# Patient Record
Sex: Female | Born: 1980 | Race: White | Hispanic: No | Marital: Married | Smoking: Never smoker
Health system: Southern US, Community
[De-identification: ages and names within clinical notes are randomized; demographics above are authoritative.]

## PROBLEM LIST (undated history)

## (undated) DIAGNOSIS — I10 Essential (primary) hypertension: Secondary | ICD-10-CM

## (undated) DIAGNOSIS — F419 Anxiety disorder, unspecified: Secondary | ICD-10-CM

## (undated) DIAGNOSIS — F909 Attention-deficit hyperactivity disorder, unspecified type: Secondary | ICD-10-CM

## (undated) HISTORY — PX: CHOLECYSTECTOMY: SHX55

## (undated) HISTORY — PX: APPENDECTOMY: SHX54

## (undated) HISTORY — PX: TONSILLECTOMY: SUR1361

---

## 2009-01-24 ENCOUNTER — Emergency Department (HOSPITAL_BASED_OUTPATIENT_CLINIC_OR_DEPARTMENT_OTHER): Admission: EM | Admit: 2009-01-24 | Discharge: 2009-01-24 | Payer: Self-pay | Admitting: Emergency Medicine

## 2017-09-10 ENCOUNTER — Other Ambulatory Visit: Payer: Self-pay

## 2017-09-10 ENCOUNTER — Encounter (HOSPITAL_BASED_OUTPATIENT_CLINIC_OR_DEPARTMENT_OTHER): Payer: Self-pay | Admitting: *Deleted

## 2017-09-10 ENCOUNTER — Emergency Department (HOSPITAL_BASED_OUTPATIENT_CLINIC_OR_DEPARTMENT_OTHER): Payer: No Typology Code available for payment source

## 2017-09-10 ENCOUNTER — Emergency Department (HOSPITAL_BASED_OUTPATIENT_CLINIC_OR_DEPARTMENT_OTHER)
Admission: EM | Admit: 2017-09-10 | Discharge: 2017-09-10 | Disposition: A | Discharge status: Home / Self Care | Payer: No Typology Code available for payment source | Attending: Emergency Medicine | Admitting: Emergency Medicine

## 2017-09-10 DIAGNOSIS — S99911A Unspecified injury of right ankle, initial encounter: Secondary | ICD-10-CM | POA: Diagnosis present

## 2017-09-10 DIAGNOSIS — Y999 Unspecified external cause status: Secondary | ICD-10-CM | POA: Insufficient documentation

## 2017-09-10 DIAGNOSIS — W109XXA Fall (on) (from) unspecified stairs and steps, initial encounter: Secondary | ICD-10-CM | POA: Diagnosis not present

## 2017-09-10 DIAGNOSIS — S82891A Other fracture of right lower leg, initial encounter for closed fracture: Secondary | ICD-10-CM | POA: Diagnosis not present

## 2017-09-10 DIAGNOSIS — Y9301 Activity, walking, marching and hiking: Secondary | ICD-10-CM | POA: Insufficient documentation

## 2017-09-10 DIAGNOSIS — I10 Essential (primary) hypertension: Secondary | ICD-10-CM | POA: Insufficient documentation

## 2017-09-10 DIAGNOSIS — S93491A Sprain of other ligament of right ankle, initial encounter: Secondary | ICD-10-CM | POA: Diagnosis not present

## 2017-09-10 DIAGNOSIS — Z79899 Other long term (current) drug therapy: Secondary | ICD-10-CM | POA: Insufficient documentation

## 2017-09-10 DIAGNOSIS — Y929 Unspecified place or not applicable: Secondary | ICD-10-CM | POA: Diagnosis not present

## 2017-09-10 HISTORY — DX: Essential (primary) hypertension: I10

## 2017-09-10 HISTORY — DX: Anxiety disorder, unspecified: F41.9

## 2017-09-10 HISTORY — DX: Attention-deficit hyperactivity disorder, unspecified type: F90.9

## 2017-09-10 MED ORDER — ACETAMINOPHEN 500 MG PO TABS
1000.0000 mg | ORAL_TABLET | Freq: Once | ORAL | Status: AC
  Administered 2017-09-10: 1000 mg via ORAL
  Filled 2017-09-10: qty 2

## 2017-09-10 MED ORDER — OXYCODONE HCL 5 MG PO TABS
5.0000 mg | ORAL_TABLET | Freq: Once | ORAL | Status: DC
Start: 2017-09-10 — End: 2017-09-11
  Filled 2017-09-10: qty 1

## 2017-09-10 MED ORDER — MORPHINE SULFATE 15 MG PO TABS: 15.0000 mg | ORAL_TABLET | ORAL | 0 refills | Status: DC | PRN

## 2017-09-10 MED ORDER — KETOROLAC TROMETHAMINE 15 MG/ML IJ SOLN
15.0000 mg | Freq: Once | INTRAMUSCULAR | Status: AC
  Administered 2017-09-10: 15 mg via INTRAMUSCULAR
  Filled 2017-09-10: qty 1

## 2017-09-10 NOTE — Discharge Instructions (Signed)

## 2017-09-10 NOTE — ED Provider Notes (Signed)
[facility] EMERGENCY DEPARTMENT Provider Note   CSN: [id number] Arrival date & time: 09/10/17  2025     History   Chief Complaint Chief Complaint  Patient presents with  . Fall  . Foot Injury    HPI Kathryn ColeJennifer Holloway is a 37 y.o. female.  37 yo F with a chief complaint of right ankle pain.  This happened when the patient lost her balance and fell down about 4 steps.  She thinks that she inverted the ankle.  Pain to the right lateral aspect of the foot as well as the ankle.  Denies other injury denies pain at the knee.  The history is provided by the patient.  Fall  This is a new problem. The current episode started 3 to 5 hours ago. The problem occurs constantly. The problem has not changed since onset.Pertinent negatives include no chest pain, no headaches and no shortness of breath. The symptoms are aggravated by bending and walking. Nothing relieves the symptoms. She has tried nothing for the symptoms. The treatment provided no relief.  Foot Injury      Past Medical History:  Diagnosis Date  . ADHD   . Anxiety   . Hypertension     There are no active problems to display for this patient.   Past Surgical History:  Procedure Laterality Date  . APPENDECTOMY    . CESAREAN SECTION    . CHOLECYSTECTOMY    . TONSILLECTOMY       OB History   None      Home Medications    Prior to Admission medications   Medication Sig Start Date End Date Taking? Authorizing Provider  desvenlafaxine (PRISTIQ) 100 MG 24 hr tablet Take 100 mg by mouth daily.   Yes [provider]  lisdexamfetamine (VYVANSE) 70 MG capsule Take 70 mg by mouth daily.   Yes [provider]  Nebivolol HCl (BYSTOLIC PO) Take by mouth.   Yes [provider]  morphine (MSIR) 15 MG tablet Take 1 tablet (15 mg total) by mouth every 4 (four) hours as needed for severe pain. 09/10/17   Melene PlanFloyd, Gitty Osterlund, DO    Family History No family history on file.  Social  History Social History   Tobacco Use  . Smoking status: Never Smoker  . Smokeless tobacco: Never Used  Substance Use Topics  . Alcohol use: Never    Frequency: Never  . Drug use: Never     Allergies   Amoxicillin and Sulfa antibiotics   Review of Systems Review of Systems  Constitutional: Negative for chills and fever.  HENT: Negative for congestion and rhinorrhea.   Eyes: Negative for redness and visual disturbance.  Respiratory: Negative for shortness of breath and wheezing.   Cardiovascular: Negative for chest pain and palpitations.  Gastrointestinal: Negative for nausea and vomiting.  Genitourinary: Negative for dysuria and urgency.  Musculoskeletal: Positive for arthralgias. Negative for myalgias.  Skin: Negative for pallor and wound.  Neurological: Negative for dizziness and headaches.     Physical Exam Updated Vital Signs BP (!) 145/94 (BP Location: Left Arm)   Pulse 90   Temp 98.8 F (37.1 C) (Oral)   Resp 20   Ht 5\' 8"  (1.727 m)   Wt 136.1 kg   LMP 08/20/2017   SpO2 98%   BMI 45.61 kg/m   Physical Exam  Constitutional: She is oriented to person, place, and time. She appears well-developed and well-nourished. No distress.  HENT:  Head: Normocephalic and atraumatic.  Eyes:  Pupils are equal, round, and reactive to light. EOM are normal.  Neck: Normal range of motion. Neck supple.  Cardiovascular: Normal rate and regular rhythm. Exam reveals no gallop and no friction rub.  No murmur heard. Pulmonary/Chest: Effort normal. She has no wheezes. She has no rales.  Abdominal: Soft. She exhibits no distension. There is no tenderness.  Musculoskeletal: She exhibits no edema or tenderness.  Pain and swelling to the right lateral ankle, just distal to the lateral malleolus, pain to the base of the fifth metatarsal.   Neurological: She is alert and oriented to person, place, and time.  Skin: Skin is warm and dry. She is not diaphoretic.  Psychiatric: She has a  normal mood and affect. Her behavior is normal.  Nursing note and vitals reviewed.    ED Treatments / Results  Labs (all labs ordered are listed, but only abnormal results are displayed) Labs Reviewed - No data to display  EKG None  Radiology Dg Ankle Complete Right  Result Date: 09/10/2017 CLINICAL DATA:  Ankle pain after fall EXAM: RIGHT ANKLE - COMPLETE 3+ VIEW COMPARISON:  None. FINDINGS: There is no evidence of fracture, dislocation, or joint effusion. There is no evidence of arthropathy or other focal bone abnormality. Soft tissues are unremarkable. IMPRESSION: Negative. Electronically Signed   By: Jasmine PangKim  Fujinaga M.D.   On: 09/10/2017 21:23   Dg Foot Complete Right  Result Date: 09/10/2017 CLINICAL DATA:  Larey SeatFell down steps with foot and ankle pain EXAM: RIGHT FOOT COMPLETE - 3+ VIEW COMPARISON:  None. FINDINGS: No malalignment. Small ossific densities adjacent to the lateral cuboid and anterior calcaneus. Small plantar calcaneal spur IMPRESSION: Small bony densities adjacent to the lateral cuboid and anterior calcaneus, may reflect small age indeterminate fracture fragments. Electronically Signed   By: Jasmine PangKim  Fujinaga M.D.   On: 09/10/2017 21:22    Procedures Procedures (including critical care time)  Medications Ordered in ED Medications  oxyCODONE (Oxy IR/ROXICODONE) immediate release tablet 5 mg (5 mg Oral Not Given 09/10/17 2135)  acetaminophen (TYLENOL) tablet 1,000 mg (1,000 mg Oral Given 09/10/17 2132)  ketorolac (TORADOL) 15 MG/ML injection 15 mg (15 mg Intramuscular Given 09/10/17 2126)     Initial Impression / Assessment and Plan / ED Course  I have reviewed the triage vital signs and the nursing notes.  Pertinent labs & imaging results that were available during my care of the patient were reviewed by me and considered in my medical decision making (see chart for details).     37 yo F with a chief complaint of right ankle pain.  Will obtain a plain film of the ankle  and foot. Plain film with increased density along the course of the ATF.  I suspect that this is just a sprain, radiology read as a possible avulsion fracture.  I suspect will be treated the same.  Placed in a cam walker crutches given sports medicine follow-up.  The patients results and plan were reviewed and discussed.   Any x-rays performed were independently reviewed by myself.   Differential diagnosis were considered with the presenting HPI.  Medications  oxyCODONE (Oxy IR/ROXICODONE) immediate release tablet 5 mg (5 mg Oral Not Given 09/10/17 2135)  acetaminophen (TYLENOL) tablet 1,000 mg (1,000 mg Oral Given 09/10/17 2132)  ketorolac (TORADOL) 15 MG/ML injection 15 mg (15 mg Intramuscular Given 09/10/17 2126)    Vitals:   09/10/17 2029 09/10/17 2030  BP:  (!) 145/94  Pulse:  90  Resp:  20  Temp:  98.8 F (37.1 C)  TempSrc:  Oral  SpO2:  98%  Weight: 136.1 kg   Height: 5\' 8"  (1.727 m)     Final diagnoses:  Sprain of anterior talofibular ligament of right ankle, initial encounter  Avulsion fracture of ankle, right, closed, initial encounter    Admission/ observation were discussed with the admitting physician, patient and/or family and they are comfortable with the plan.    Final Clinical Impressions(s) / ED Diagnoses   Final diagnoses:  Sprain of anterior talofibular ligament of right ankle, initial encounter  Avulsion fracture of ankle, right, closed, initial encounter    ED Discharge Orders         Ordered    morphine (MSIR) 15 MG tablet  Every 4 hours PRN     09/10/17 2139           Melene Plan, DO 09/10/17 2146

## 2017-09-10 NOTE — ED Triage Notes (Signed)
She fell down 4 steps an hour ago. Injury to her right foot. Swelling noted.

## 2018-01-29 ENCOUNTER — Encounter (HOSPITAL_BASED_OUTPATIENT_CLINIC_OR_DEPARTMENT_OTHER): Payer: Self-pay | Admitting: *Deleted

## 2018-01-29 ENCOUNTER — Other Ambulatory Visit: Payer: Self-pay

## 2018-01-29 ENCOUNTER — Emergency Department (HOSPITAL_BASED_OUTPATIENT_CLINIC_OR_DEPARTMENT_OTHER)
Admission: EM | Admit: 2018-01-29 | Discharge: 2018-01-29 | Disposition: A | Discharge status: Home / Self Care | Payer: No Typology Code available for payment source | Attending: Emergency Medicine | Admitting: Emergency Medicine

## 2018-01-29 ENCOUNTER — Emergency Department (HOSPITAL_BASED_OUTPATIENT_CLINIC_OR_DEPARTMENT_OTHER): Payer: No Typology Code available for payment source

## 2018-01-29 DIAGNOSIS — I10 Essential (primary) hypertension: Secondary | ICD-10-CM | POA: Insufficient documentation

## 2018-01-29 DIAGNOSIS — F909 Attention-deficit hyperactivity disorder, unspecified type: Secondary | ICD-10-CM | POA: Insufficient documentation

## 2018-01-29 DIAGNOSIS — N23 Unspecified renal colic: Secondary | ICD-10-CM | POA: Diagnosis not present

## 2018-01-29 DIAGNOSIS — Z79899 Other long term (current) drug therapy: Secondary | ICD-10-CM | POA: Diagnosis not present

## 2018-01-29 DIAGNOSIS — R109 Unspecified abdominal pain: Secondary | ICD-10-CM | POA: Diagnosis present

## 2018-01-29 DIAGNOSIS — N130 Hydronephrosis with ureteropelvic junction obstruction: Secondary | ICD-10-CM | POA: Insufficient documentation

## 2018-01-29 DIAGNOSIS — N201 Calculus of ureter: Secondary | ICD-10-CM

## 2018-01-29 LAB — CBC WITH DIFFERENTIAL/PLATELET
ABS IMMATURE GRANULOCYTES: 0.12 10*3/uL — AB (ref 0.00–0.07)
BASOS ABS: 0.1 10*3/uL (ref 0.0–0.1)
BASOS PCT: 1 %
Eosinophils Absolute: 0.1 10*3/uL (ref 0.0–0.5)
Eosinophils Relative: 1 %
HCT: 41 % (ref 36.0–46.0)
Hemoglobin: 12.9 g/dL (ref 12.0–15.0)
Immature Granulocytes: 1 %
LYMPHS PCT: 27 %
Lymphs Abs: 3.2 10*3/uL (ref 0.7–4.0)
MCH: 28.9 pg (ref 26.0–34.0)
MCHC: 31.5 g/dL (ref 30.0–36.0)
MCV: 91.7 fL (ref 80.0–100.0)
Monocytes Absolute: 0.6 10*3/uL (ref 0.1–1.0)
Monocytes Relative: 5 %
NEUTROS ABS: 7.6 10*3/uL (ref 1.7–7.7)
NRBC: 0 % (ref 0.0–0.2)
Neutrophils Relative %: 65 %
PLATELETS: 288 10*3/uL (ref 150–400)
RBC: 4.47 MIL/uL (ref 3.87–5.11)
RDW: 13.2 % (ref 11.5–15.5)
WBC: 11.7 10*3/uL — ABNORMAL HIGH (ref 4.0–10.5)

## 2018-01-29 LAB — URINALYSIS, ROUTINE W REFLEX MICROSCOPIC
BILIRUBIN URINE: NEGATIVE
GLUCOSE, UA: NEGATIVE mg/dL
KETONES UR: NEGATIVE mg/dL
Leukocytes, UA: NEGATIVE
NITRITE: NEGATIVE
Protein, ur: NEGATIVE mg/dL
Specific Gravity, Urine: 1.02 (ref 1.005–1.030)
pH: 5.5 (ref 5.0–8.0)

## 2018-01-29 LAB — BASIC METABOLIC PANEL
ANION GAP: 7 (ref 5–15)
BUN: 11 mg/dL (ref 6–20)
CHLORIDE: 102 mmol/L (ref 98–111)
CO2: 26 mmol/L (ref 22–32)
Calcium: 8.9 mg/dL (ref 8.9–10.3)
Creatinine, Ser: 0.66 mg/dL (ref 0.44–1.00)
Glucose, Bld: 138 mg/dL — ABNORMAL HIGH (ref 70–99)
POTASSIUM: 3.9 mmol/L (ref 3.5–5.1)
Sodium: 135 mmol/L (ref 135–145)

## 2018-01-29 LAB — URINALYSIS, MICROSCOPIC (REFLEX)

## 2018-01-29 LAB — PREGNANCY, URINE: Preg Test, Ur: NEGATIVE

## 2018-01-29 MED ORDER — ONDANSETRON 4 MG PO TBDP: 4.0000 mg | ORAL_TABLET | Freq: Three times a day (TID) | ORAL | 0 refills | Status: AC | PRN

## 2018-01-29 MED ORDER — SODIUM CHLORIDE 0.9 % IV BOLUS
1000.0000 mL | Freq: Once | INTRAVENOUS | Status: AC
  Administered 2018-01-29: 1000 mL via INTRAVENOUS

## 2018-01-29 MED ORDER — HYDROCODONE-ACETAMINOPHEN 5-325 MG PO TABS: 1.0000 | ORAL_TABLET | Freq: Four times a day (QID) | ORAL | 0 refills | Status: DC | PRN

## 2018-01-29 NOTE — ED Provider Notes (Signed)
[facility] EMERGENCY DEPARTMENT Provider Note   CSN: [id number] Arrival date & time: 01/29/18  1748     History   Chief Complaint Chief Complaint  Patient presents with  . Flank Pain    HPI Kathryn ColeJennifer Holloway is a 37 y.o. female.  HPI   Kathryn ColeJennifer Bunnell is a 37 y.o. female, with a history of anxiety and HTN, presenting to the ED with left back pain beginning this afternoon while at rest.  At onset, pain was sharp, 10/10, radiating to the left flank and left abdomen.  Pain has since subsided, now rated 3/10.  Accompanied by nausea. She has not experienced this pain before.  Denies fever/chills, chest pain, shortness of breath, dysuria, hematuria, vomiting, diarrhea, hematochezia/melena, neuro deficits, or any other complaints.     Past Medical History:  Diagnosis Date  . ADHD   . Anxiety   . Hypertension     There are no active problems to display for this patient.   Past Surgical History:  Procedure Laterality Date  . APPENDECTOMY    . CESAREAN SECTION    . CHOLECYSTECTOMY    . TONSILLECTOMY       OB History   No obstetric history on file.      Home Medications    Prior to Admission medications   Medication Sig Start Date End Date Taking? Authorizing Provider  amphetamine-dextroamphetamine (ADDERALL XR) 30 MG 24 hr capsule Take by mouth. 12/07/17  Yes [provider]  desvenlafaxine (PRISTIQ) 100 MG 24 hr tablet Take 100 mg by mouth daily.   Yes [provider]  Nebivolol HCl (BYSTOLIC PO) Take by mouth.   Yes [provider]  omalizumab Geoffry Paradise(XOLAIR) 150 MG injection Inject into the skin. 08/17/17  Yes [provider]  HYDROcodone-acetaminophen (NORCO/VICODIN) 5-325 MG tablet Take 1-2 tablets by mouth every 6 (six) hours as needed for severe pain. 01/29/18   Uchenna Rappaport C, PA-C  lisdexamfetamine (VYVANSE) 70 MG capsule Take 70 mg by mouth daily.    [provider]  morphine (MSIR) 15 MG tablet Take 1  tablet (15 mg total) by mouth every 4 (four) hours as needed for severe pain. 09/10/17   Melene PlanFloyd, Dan, DO  ondansetron (ZOFRAN ODT) 4 MG disintegrating tablet Take 1 tablet (4 mg total) by mouth every 8 (eight) hours as needed for nausea or vomiting. 01/29/18   Jaimere Feutz, Hillard DankerShawn C, PA-C    Family History No family history on file.  Social History Social History   Tobacco Use  . Smoking status: Never Smoker  . Smokeless tobacco: Never Used  Substance Use Topics  . Alcohol use: Never    Frequency: Never  . Drug use: Never     Allergies   Amoxicillin and Sulfa antibiotics   Review of Systems Review of Systems  Constitutional: Negative for chills, diaphoresis and fever.  Respiratory: Negative for cough and shortness of breath.   Cardiovascular: Negative for chest pain.  Gastrointestinal: Positive for nausea. Negative for blood in stool, constipation, diarrhea and vomiting.  Genitourinary: Positive for flank pain. Negative for dysuria, hematuria, vaginal bleeding and vaginal discharge.  Musculoskeletal: Positive for back pain.  Neurological: Negative for weakness and numbness.  All other systems reviewed and are negative.    Physical Exam Updated Vital Signs BP 139/85 (BP Location: Left Arm)   Pulse 93   Temp 98.2 F (36.8 C) (Oral)   Resp 20   Ht 5\' 8"  (1.727 m)   Wt 136.1 kg   LMP  01/22/2018   SpO2 95%   BMI 45.61 kg/m   Physical Exam Vitals signs and nursing note reviewed.  Constitutional:      General: She is not in acute distress.    Appearance: She is well-developed. She is not diaphoretic.  HENT:     Head: Normocephalic and atraumatic.  Eyes:     Conjunctiva/sclera: Conjunctivae normal.  Neck:     Musculoskeletal: Neck supple.  Cardiovascular:     Rate and Rhythm: Normal rate and regular rhythm.     Heart sounds: Normal heart sounds.  Pulmonary:     Effort: Pulmonary effort is normal. No respiratory distress.     Breath sounds: Normal breath sounds.    Abdominal:     Palpations: Abdomen is soft.     Tenderness: There is no abdominal tenderness. There is left CVA tenderness. There is no guarding.  Lymphadenopathy:     Cervical: No cervical adenopathy.  Skin:    General: Skin is warm and dry.  Neurological:     Mental Status: She is alert.  Psychiatric:        Behavior: Behavior normal.      ED Treatments / Results  Labs (all labs ordered are listed, but only abnormal results are displayed) Labs Reviewed  URINALYSIS, ROUTINE W REFLEX MICROSCOPIC - Abnormal; Notable for the following components:      Result Value   Hgb urine dipstick LARGE (*)    All other components within normal limits  URINALYSIS, MICROSCOPIC (REFLEX) - Abnormal; Notable for the following components:   Bacteria, UA RARE (*)    All other components within normal limits  BASIC METABOLIC PANEL - Abnormal; Notable for the following components:   Glucose, Bld 138 (*)    All other components within normal limits  CBC WITH DIFFERENTIAL/PLATELET - Abnormal; Notable for the following components:   WBC 11.7 (*)    Abs Immature Granulocytes 0.12 (*)    All other components within normal limits  PREGNANCY, URINE    EKG None  Radiology Ct Renal Stone Study  Result Date: 01/29/2018 CLINICAL DATA:  Left flank pain for 1 day. Dyspnea. History of appendectomy and cholecystectomy. EXAM: CT ABDOMEN AND PELVIS WITHOUT CONTRAST TECHNIQUE: Multidetector CT imaging of the abdomen and pelvis was performed following the standard protocol without IV contrast. COMPARISON:  None. FINDINGS: Lower chest: No significant pulmonary nodules or acute consolidative airspace disease. Hepatobiliary: Diffuse hepatic steatosis. No definite liver surface irregularity. Normal liver size. No liver mass. Cholecystectomy. No biliary ductal dilatation. Pancreas: Normal, with no mass or duct dilation. Spleen: Normal size. No mass. Adrenals/Urinary Tract: Normal adrenals. Obstructing 3 mm left  ureterovesical junction stone with mild left hydroureteronephrosis. No stones in the kidneys. No additional ureteral stones. No right hydronephrosis. Normal caliber right ureter. No contour deforming renal masses. Normal bladder. Stomach/Bowel: Normal non-distended stomach. Normal caliber small bowel with no small bowel wall thickening. Appendectomy. Normal large bowel with no diverticulosis, large bowel wall thickening or pericolonic fat stranding. Vascular/Lymphatic: Normal caliber abdominal aorta. No pathologically enlarged lymph nodes in the abdomen or pelvis. Reproductive: Normal uterus. Simple 5.4 cm right adnexal cyst (series 2/image 87). No left adnexal mass. Other: No pneumoperitoneum, ascites or focal fluid collection. Musculoskeletal: No aggressive appearing focal osseous lesions. Mild lower thoracic spondylosis. IMPRESSION: 1. Obstructing 3 mm left UVJ stone with mild left hydroureteronephrosis. 2. Simple 5.4 cm right adnexal cyst. Follow-up pelvic ultrasound advised in 6-12 weeks. This recommendation follows ACR consensus guidelines: White Paper of the ACR  Incidental Findings Committee II on Adnexal Findings. J Am Coll Radiol 2013:10:675-681. 3. Diffuse hepatic steatosis. Electronically Signed   By: Delbert Phenix M.D.   On: 01/29/2018 21:19    Procedures Procedures (including critical care time)  Medications Ordered in ED Medications  sodium chloride 0.9 % bolus 1,000 mL ( Intravenous Stopped 01/29/18 2151)     Initial Impression / Assessment and Plan / ED Course  I have reviewed the triage vital signs and the nursing notes.  Pertinent labs & imaging results that were available during my care of the patient were reviewed by me and considered in my medical decision making (see chart for details).     Patient presents with left flank pain. Patient is nontoxic appearing, afebrile, not tachycardic, not tachypneic, not hypotensive, and is in no apparent distress.  3 mm stone at the UVJ.   Patient's pain is well controlled without analgesics here in the ED. The patient was given instructions for home care as well as return precautions. Patient voices understanding of these instructions, accepts the plan, and is comfortable with discharge.    Final Clinical Impressions(s) / ED Diagnoses   Final diagnoses:  Ureterolithiasis  Ureteral colic    ED Discharge Orders         Ordered    HYDROcodone-acetaminophen (NORCO/VICODIN) 5-325 MG tablet  Every 6 hours PRN     01/29/18 2217    ondansetron (ZOFRAN ODT) 4 MG disintegrating tablet  Every 8 hours PRN     01/29/18 2217           Anselm Pancoast, PA-C 01/29/18 2332    Gwyneth Sprout, MD 01/31/18 2154

## 2018-01-29 NOTE — ED Notes (Signed)
Patient transported to CT 

## 2018-01-29 NOTE — ED Notes (Signed)
Pt collected enough drops of urine for a urine preg but not a UA.  Pt aware that she needs another urine specimen as soon as she is able.

## 2018-01-29 NOTE — Discharge Instructions (Signed)
°  Kidney Stone There is evidence of a kidney stone on the left side.  It appears as though it is on its way out.  Some kidney stones can take up to 30 days to pass. Hydration: Hydration is key to helping a kidney stone pass.  Have a goal of half a liter of water every hour or two. Antiinflammatory medications: Take 600 mg of ibuprofen every 6 hours or 440 mg (over the counter dose) to 500 mg (prescription dose) of naproxen every 12 hours for the next 3 days. After this time, these medications may be used as needed for pain. Take these medications with food to avoid upset stomach. Choose only one of these medications, do not take them together. Tylenol: Should you continue to have additional pain while taking the ibuprofen or naproxen, you may add in tylenol as needed. Your daily total maximum amount of tylenol from all sources should be limited to 4000mg /day for persons without liver problems, or 2000mg /day for those with liver problems. Vicodin: May take Vicodin (hydrocodone-acetaminophen) as needed for severe pain.  Do not drive or perform other dangerous activities while taking the Vicodin.  Please note that each pill of Vicodin contains 325 mg of acetaminophen (Tylenol) and the above dosage limits apply. Zofran: Use of Zofran, as needed, for nausea/vomiting. Follow-up: Follow-up with the urologist as soon as possible on this matter.  For prescription assistance, may try using prescription discount sites or apps, such as goodrx.com

## 2018-01-29 NOTE — ED Triage Notes (Signed)
Left flank pain

## 2019-02-18 ENCOUNTER — Telehealth: Payer: Self-pay | Admitting: Emergency Medicine

## 2019-02-18 ENCOUNTER — Encounter (HOSPITAL_BASED_OUTPATIENT_CLINIC_OR_DEPARTMENT_OTHER): Payer: Self-pay

## 2019-02-18 ENCOUNTER — Other Ambulatory Visit: Payer: Self-pay

## 2019-02-18 ENCOUNTER — Inpatient Hospital Stay (HOSPITAL_BASED_OUTPATIENT_CLINIC_OR_DEPARTMENT_OTHER)
Admission: EM | Admit: 2019-02-18 | Discharge: 2019-02-20 | DRG: 177 | Disposition: A | Discharge status: Home / Self Care | Payer: PRIVATE HEALTH INSURANCE | Attending: Internal Medicine | Admitting: Internal Medicine

## 2019-02-18 ENCOUNTER — Emergency Department (HOSPITAL_BASED_OUTPATIENT_CLINIC_OR_DEPARTMENT_OTHER): Payer: PRIVATE HEALTH INSURANCE

## 2019-02-18 DIAGNOSIS — Z880 Allergy status to penicillin: Secondary | ICD-10-CM

## 2019-02-18 DIAGNOSIS — Z9089 Acquired absence of other organs: Secondary | ICD-10-CM

## 2019-02-18 DIAGNOSIS — F909 Attention-deficit hyperactivity disorder, unspecified type: Secondary | ICD-10-CM | POA: Diagnosis present

## 2019-02-18 DIAGNOSIS — R0602 Shortness of breath: Secondary | ICD-10-CM | POA: Diagnosis present

## 2019-02-18 DIAGNOSIS — G8929 Other chronic pain: Secondary | ICD-10-CM | POA: Diagnosis present

## 2019-02-18 DIAGNOSIS — Z98891 History of uterine scar from previous surgery: Secondary | ICD-10-CM | POA: Diagnosis not present

## 2019-02-18 DIAGNOSIS — J45901 Unspecified asthma with (acute) exacerbation: Secondary | ICD-10-CM | POA: Diagnosis present

## 2019-02-18 DIAGNOSIS — J1282 Pneumonia due to coronavirus disease 2019: Secondary | ICD-10-CM | POA: Diagnosis present

## 2019-02-18 DIAGNOSIS — Z79891 Long term (current) use of opiate analgesic: Secondary | ICD-10-CM

## 2019-02-18 DIAGNOSIS — U071 COVID-19: Secondary | ICD-10-CM | POA: Diagnosis present

## 2019-02-18 DIAGNOSIS — L501 Idiopathic urticaria: Secondary | ICD-10-CM | POA: Diagnosis present

## 2019-02-18 DIAGNOSIS — R0902 Hypoxemia: Secondary | ICD-10-CM

## 2019-02-18 DIAGNOSIS — I10 Essential (primary) hypertension: Secondary | ICD-10-CM | POA: Diagnosis present

## 2019-02-18 DIAGNOSIS — E119 Type 2 diabetes mellitus without complications: Secondary | ICD-10-CM | POA: Diagnosis present

## 2019-02-18 DIAGNOSIS — Z79899 Other long term (current) drug therapy: Secondary | ICD-10-CM

## 2019-02-18 DIAGNOSIS — F419 Anxiety disorder, unspecified: Secondary | ICD-10-CM | POA: Diagnosis present

## 2019-02-18 DIAGNOSIS — Z9049 Acquired absence of other specified parts of digestive tract: Secondary | ICD-10-CM

## 2019-02-18 DIAGNOSIS — M545 Low back pain: Secondary | ICD-10-CM | POA: Diagnosis present

## 2019-02-18 DIAGNOSIS — J9601 Acute respiratory failure with hypoxia: Secondary | ICD-10-CM | POA: Diagnosis present

## 2019-02-18 DIAGNOSIS — Z6841 Body Mass Index (BMI) 40.0 and over, adult: Secondary | ICD-10-CM | POA: Diagnosis not present

## 2019-02-18 DIAGNOSIS — K760 Fatty (change of) liver, not elsewhere classified: Secondary | ICD-10-CM | POA: Diagnosis present

## 2019-02-18 DIAGNOSIS — G43909 Migraine, unspecified, not intractable, without status migrainosus: Secondary | ICD-10-CM | POA: Diagnosis present

## 2019-02-18 DIAGNOSIS — Z882 Allergy status to sulfonamides status: Secondary | ICD-10-CM | POA: Diagnosis not present

## 2019-02-18 DIAGNOSIS — F329 Major depressive disorder, single episode, unspecified: Secondary | ICD-10-CM | POA: Diagnosis present

## 2019-02-18 LAB — CBC WITH DIFFERENTIAL/PLATELET
Abs Immature Granulocytes: 0.12 10*3/uL — ABNORMAL HIGH (ref 0.00–0.07)
Basophils Absolute: 0 10*3/uL (ref 0.0–0.1)
Basophils Relative: 0 %
Eosinophils Absolute: 0 10*3/uL (ref 0.0–0.5)
Eosinophils Relative: 0 %
HCT: 41.7 % (ref 36.0–46.0)
Hemoglobin: 13.2 g/dL (ref 12.0–15.0)
Immature Granulocytes: 2 %
Lymphocytes Relative: 33 %
Lymphs Abs: 2.2 10*3/uL (ref 0.7–4.0)
MCH: 28.6 pg (ref 26.0–34.0)
MCHC: 31.7 g/dL (ref 30.0–36.0)
MCV: 90.5 fL (ref 80.0–100.0)
Monocytes Absolute: 0.5 10*3/uL (ref 0.1–1.0)
Monocytes Relative: 8 %
Neutro Abs: 3.9 10*3/uL (ref 1.7–7.7)
Neutrophils Relative %: 57 %
Platelets: 251 10*3/uL (ref 150–400)
RBC: 4.61 MIL/uL (ref 3.87–5.11)
RDW: 12.9 % (ref 11.5–15.5)
WBC: 6.8 10*3/uL (ref 4.0–10.5)
nRBC: 0 % (ref 0.0–0.2)

## 2019-02-18 LAB — PROCALCITONIN: Procalcitonin: <0.1 ng/mL

## 2019-02-18 LAB — COMPREHENSIVE METABOLIC PANEL
ALT: 23 U/L (ref 0–44)
AST: 42 U/L — ABNORMAL HIGH (ref 15–41)
Albumin: 3.7 g/dL (ref 3.5–5.0)
Alkaline Phosphatase: 68 U/L (ref 38–126)
Anion gap: 11 (ref 5–15)
BUN: 9 mg/dL (ref 6–20)
CO2: 22 mmol/L (ref 22–32)
Calcium: 8.9 mg/dL (ref 8.9–10.3)
Chloride: 104 mmol/L (ref 98–111)
Creatinine, Ser: 0.54 mg/dL (ref 0.44–1.00)
GFR calc Af Amer: >60 mL/min (ref >60)
GFR calc non Af Amer: >60 mL/min (ref >60)
Glucose, Bld: 141 mg/dL — ABNORMAL HIGH (ref 70–99)
Potassium: 3.5 mmol/L (ref 3.5–5.1)
Sodium: 137 mmol/L (ref 135–145)
Total Bilirubin: 0.4 mg/dL (ref 0.3–1.2)
Total Protein: 8.3 g/dL — ABNORMAL HIGH (ref 6.5–8.1)

## 2019-02-18 LAB — TRIGLYCERIDES: Triglycerides: 245 mg/dL — ABNORMAL HIGH (ref <150)

## 2019-02-18 LAB — FIBRINOGEN: Fibrinogen: 585 mg/dL — ABNORMAL HIGH (ref 210–475)

## 2019-02-18 LAB — C-REACTIVE PROTEIN: CRP: 2.5 mg/dL — ABNORMAL HIGH (ref <1.0)

## 2019-02-18 LAB — FERRITIN: Ferritin: 174 ng/mL (ref 11–307)

## 2019-02-18 LAB — PREGNANCY, URINE: Preg Test, Ur: NEGATIVE

## 2019-02-18 LAB — LACTIC ACID, PLASMA: Lactic Acid, Venous: 1.9 mmol/L (ref 0.5–1.9)

## 2019-02-18 LAB — D-DIMER, QUANTITATIVE: D-Dimer, Quant: 0.91 ug/mL-FEU — ABNORMAL HIGH (ref 0.00–0.50)

## 2019-02-18 LAB — LACTATE DEHYDROGENASE: LDH: 246 U/L — ABNORMAL HIGH (ref 98–192)

## 2019-02-18 LAB — HEMOGLOBIN A1C
Hgb A1c MFr Bld: 7.1 % — ABNORMAL HIGH (ref 4.8–5.6)
Mean Plasma Glucose: 157.07 mg/dL

## 2019-02-18 MED ORDER — ENOXAPARIN SODIUM 80 MG/0.8ML ~~LOC~~ SOLN
65.0000 mg | SUBCUTANEOUS | Status: DC
  Administered 2019-02-19 – 2019-02-20 (×2): 65 mg via SUBCUTANEOUS
  Filled 2019-02-18 (×2): qty 0.8

## 2019-02-18 MED ORDER — ACETAMINOPHEN 325 MG PO TABS
650.0000 mg | ORAL_TABLET | Freq: Four times a day (QID) | ORAL | Status: DC | PRN
  Administered 2019-02-19: 650 mg via ORAL
  Filled 2019-02-18: qty 2

## 2019-02-18 MED ORDER — GUAIFENESIN-DM 100-10 MG/5ML PO SYRP: 10.0000 mL | ORAL_SOLUTION | ORAL | Status: DC | PRN

## 2019-02-18 MED ORDER — METHYLPREDNISOLONE SODIUM SUCC 125 MG IJ SOLR
60.0000 mg | INTRAMUSCULAR | Status: DC
  Administered 2019-02-18: 60 mg via INTRAVENOUS
  Filled 2019-02-18: qty 2

## 2019-02-18 MED ORDER — DEXAMETHASONE SODIUM PHOSPHATE 10 MG/ML IJ SOLN
6.0000 mg | INTRAMUSCULAR | Status: DC
  Administered 2019-02-19 – 2019-02-20 (×2): 6 mg via INTRAVENOUS
  Filled 2019-02-18 (×2): qty 1

## 2019-02-18 MED ORDER — INSULIN ASPART 100 UNIT/ML ~~LOC~~ SOLN
0.0000 [IU] | Freq: Every day | SUBCUTANEOUS | Status: DC
  Administered 2019-02-19 (×2): 2 [IU] via SUBCUTANEOUS

## 2019-02-18 MED ORDER — HYDROCOD POLST-CPM POLST ER 10-8 MG/5ML PO SUER: 5.0000 mL | Freq: Two times a day (BID) | ORAL | Status: DC | PRN

## 2019-02-18 MED ORDER — IPRATROPIUM-ALBUTEROL 20-100 MCG/ACT IN AERS
1.0000 | INHALATION_SPRAY | Freq: Four times a day (QID) | RESPIRATORY_TRACT | Status: DC
  Administered 2019-02-19 – 2019-02-20 (×6): 1 via RESPIRATORY_TRACT
  Filled 2019-02-18: qty 4

## 2019-02-18 MED ORDER — SODIUM CHLORIDE 0.9 % IV SOLN
INTRAVENOUS | Status: DC | PRN
  Administered 2019-02-18: 250 mL via INTRAVENOUS

## 2019-02-18 MED ORDER — INSULIN ASPART 100 UNIT/ML ~~LOC~~ SOLN
0.0000 [IU] | Freq: Three times a day (TID) | SUBCUTANEOUS | Status: DC
  Administered 2019-02-19: 3 [IU] via SUBCUTANEOUS
  Administered 2019-02-19 (×2): 2 [IU] via SUBCUTANEOUS
  Administered 2019-02-20 (×2): 5 [IU] via SUBCUTANEOUS

## 2019-02-18 MED ORDER — SENNOSIDES-DOCUSATE SODIUM 8.6-50 MG PO TABS: 1.0000 | ORAL_TABLET | Freq: Every evening | ORAL | Status: DC | PRN

## 2019-02-18 MED ORDER — LACTATED RINGERS IV SOLN: INTRAVENOUS | Status: DC

## 2019-02-18 MED ORDER — ENOXAPARIN SODIUM 40 MG/0.4ML ~~LOC~~ SOLN: 40.0000 mg | SUBCUTANEOUS | Status: DC

## 2019-02-18 MED ORDER — SODIUM CHLORIDE 0.9 % IV SOLN
100.0000 mg | Freq: Every day | INTRAVENOUS | Status: DC
  Administered 2019-02-19 – 2019-02-20 (×2): 100 mg via INTRAVENOUS
  Filled 2019-02-18 (×2): qty 20

## 2019-02-18 MED ORDER — SODIUM CHLORIDE 0.9 % IV SOLN
100.0000 mg | INTRAVENOUS | Status: AC
  Administered 2019-02-18 (×2): 100 mg via INTRAVENOUS
  Filled 2019-02-18 (×2): qty 20

## 2019-02-18 NOTE — H&P (Addendum)
History and Physical    Kathryn Holloway XBM:[id number] DOB: Jul 06, 1980 DOA: 02/18/2019  PCP: Karleen Hampshire., MD   Patient coming from:[facility]  I have personally briefly reviewed patient's old medical records in [place]  Chief Complaint: Cough/SOB  HPI: Kathryn Holloway is a 39 y.o. female with medical history significant of Morbid Obesity, T2DM, ADHD, Anxiety, and HTN who presents with worsening SOB.  She was recently diagnosed with COVID on 1/12, onset of symptoms 1/10 but has had worsening symptoms despite Rx at home with Albuterol, Prednisone taper (started at lower dose of 20 mg) in setting of asthma but reports was not previously on oxygen.  She states she had severe exertional dyspnea over the past two days despite feeling comfortable while sitting still.  She has been having fevers this past week but none in the past couple of days, but now resolved.  She denies diarrhea but does report losing sense of smell and taste, the latter of which has returned.  She denies any smoking, alcohol or drugs  Lives at home with husband and daughter, reports neither have been ill   Review of Systems: As per HPI otherwise 10 point review of systems negative.    Past Medical History:  Diagnosis Date  . ADHD   . Anxiety   . Hypertension     Past Surgical History:  Procedure Laterality Date  . APPENDECTOMY    . CESAREAN SECTION    . CHOLECYSTECTOMY    . TONSILLECTOMY       reports that she has never smoked. She has never used smokeless tobacco. She reports that she does not drink alcohol or use drugs.  Allergies  Allergen Reactions  . Amoxicillin     hives  . Sulfa Antibiotics     hives    Family Hx - reviewed, non-contributory  Prior to Admission medications   Medication Sig Start Date End Date Taking? Authorizing Provider  amphetamine-dextroamphetamine (ADDERALL XR) 30 MG 24 hr capsule Take by mouth. 12/07/17   [provider]  desvenlafaxine  (PRISTIQ) 100 MG 24 hr tablet Take 100 mg by mouth daily.    [provider]  HYDROcodone-acetaminophen (NORCO/VICODIN) 5-325 MG tablet Take 1-2 tablets by mouth every 6 (six) hours as needed for severe pain. 01/29/18   Joy, Shawn C, PA-C  lisdexamfetamine (VYVANSE) 70 MG capsule Take 70 mg by mouth daily.    [provider]  morphine (MSIR) 15 MG tablet Take 1 tablet (15 mg total) by mouth every 4 (four) hours as needed for severe pain. 09/10/17   Deno Etienne, DO  Nebivolol HCl (BYSTOLIC PO) Take by mouth.    [provider]  omalizumab Arvid Right) 150 MG injection Inject into the skin. 08/17/17   [provider]  ondansetron (ZOFRAN ODT) 4 MG disintegrating tablet Take 1 tablet (4 mg total) by mouth every 8 (eight) hours as needed for nausea or vomiting. 01/29/18   Lorayne Bender, PA-C    Physical Exam: Vitals:   02/18/19 1730 02/18/19 1900 02/18/19 1945 02/18/19 2100  BP: 135/86 116/82 121/86 122/76  Pulse: 64 65 68 66  Resp: (!) 22 (!) 22 (!) 22 (!) 24  Temp:      TempSrc:      SpO2: 98% 97% 99% 96%  Weight:      Height:        Vitals:   02/18/19 1730 02/18/19 1900 02/18/19 1945 02/18/19 2100  BP: 135/86 116/82 121/86 122/76  Pulse: 64 65 68  66  Resp: (!) 22 (!) 22 (!) 22 (!) 24  Temp:      TempSrc:      SpO2: 98% 97% 99% 96%  Weight:      Height:       Constitutional: NAD, calm, comfortable, pleasant, morbidly obese Eyes: PERRL, lids and conjunctivae normal, EOMI ENMT: Mucous membranes are moist. Posterior pharynx clear of any exudate or lesions.Normal dentition.  Neck: normal, supple, no masses, no thyromegaly Respiratory: scattered crackles, prolonged expiratory phase and low pitched wheezing appreciable posteriorly, exam limited by PPE Cardiovascular: Regular rate and rhythm, no murmurs / rubs / gallops. No extremity edema. 2+ pedal pulses.  Abdomen: Obese, no tenderness, no masses palpated. No hepatosplenomegaly. Bowel sounds positive.    Musculoskeletal: no clubbing / cyanosis. No joint deformity upper and lower extremities. Good ROM, no contractures. Normal muscle tone.  Skin: no rashes, lesions, ulcers. No induration Neurologic: CN 2-12 grossly intact. Sensation and strength intact Psychiatric: Normal judgment and insight. Alert and oriented x 3. Normal mood.      Labs on Admission: I have personally reviewed following labs and imaging studies  CBC: Recent Labs  Lab 02/18/19 1614  WBC 6.8  NEUTROABS 3.9  HGB 13.2  HCT 41.7  MCV 90.5  PLT 251   Basic Metabolic Panel: Recent Labs  Lab 02/18/19 1614  NA 137  K 3.5  CL 104  CO2 22  GLUCOSE 141*  BUN 9  CREATININE 0.54  CALCIUM 8.9   GFR: Estimated Creatinine Clearance: 139.5 mL/min (by C-G formula based on SCr of 0.54 mg/dL). Liver Function Tests: Recent Labs  Lab 02/18/19 1614  AST 42*  ALT 23  ALKPHOS 68  BILITOT 0.4  PROT 8.3*  ALBUMIN 3.7   No results for input(s): LIPASE, AMYLASE in the last 168 hours. No results for input(s): AMMONIA in the last 168 hours. Coagulation Profile: No results for input(s): INR, PROTIME in the last 168 hours. Cardiac Enzymes: No results for input(s): CKTOTAL, CKMB, CKMBINDEX, TROPONINI in the last 168 hours. BNP (last 3 results) No results for input(s): PROBNP in the last 8760 hours. HbA1C: Recent Labs    02/18/19 1614  HGBA1C 7.1*   CBG: No results for input(s): GLUCAP in the last 168 hours. Lipid Profile: Recent Labs    02/18/19 1614  TRIG 245*   Thyroid Function Tests: No results for input(s): TSH, T4TOTAL, FREET4, T3FREE, THYROIDAB in the last 72 hours. Anemia Panel: Recent Labs    02/18/19 1614  FERRITIN 174   Urine analysis:    Component Value Date/Time   COLORURINE YELLOW 01/29/2018 1840   APPEARANCEUR CLEAR 01/29/2018 1840   LABSPEC 1.020 01/29/2018 1840   PHURINE 5.5 01/29/2018 1840   GLUCOSEU NEGATIVE 01/29/2018 1840   HGBUR LARGE (A) 01/29/2018 1840   BILIRUBINUR  NEGATIVE 01/29/2018 1840   KETONESUR NEGATIVE 01/29/2018 1840   PROTEINUR NEGATIVE 01/29/2018 1840   NITRITE NEGATIVE 01/29/2018 1840   LEUKOCYTESUR NEGATIVE 01/29/2018 1840    Radiological Exams on Admission: DG Chest Portable 1 View  Result Date: 02/18/2019 CLINICAL DATA:  Shortness of breath.  COVID positive. EXAM: PORTABLE CHEST 1 VIEW COMPARISON:  No prior. FINDINGS: Mediastinum is normal. Heart size normal. Diffuse multifocal bilateral pulmonary infiltrates. No pleural effusion or pneumothorax. No acute bony abnormality. IMPRESSION: Diffuse multifocal bilateral pulmonary infiltrates. Electronically Signed   By: Maisie Fus  Register   On: 02/18/2019 14:44     Assessment/Plan Shalay Carder is a 39 y.o. female with medical history significant of Morbid  Obesity, T2DM, ADHD, Depression, Anxiety, HTN, Chronic low back pain, Hepatic steatosis, Hx of nephrolithaisis and Migraines who presents with worsening SOB and acute hypoxemic respiratory failure in setting of COVID 19 Pneumonia.  # Acute Hypoxemic Respiratory Failure # COVID-19 Pneumonia - patient recently initiated on prednisone and inhalers given her hx of asthma, for COVID but at time of initiation she was not hypoxemic.  Will not continue with remdesivir, dexamethasone and oxygen supplementation.  She is presently requiring 2L via n/c. - continue supportive measures - inhalers, ICS, flutter valve - will trend inflammatory markers  # Asthma - continue inhalers, steroids as above.  Mild exac but in setting of COVID  # T2DM - Diet-controlled per patient, A1c now however is 7.1 - ISS and CBG monitoring during stay - will add linagliptin (also out-patient notes suggest she had been on sitagliptin)  # ADHD - continue Adderall  # HTN - on nebivolol, per patient this was chosen due to concurrent hx of migraines  # Migraines - proph with BB - triptan prn  # Chronic idiopathic urticaria - follows with allergy, on  omalizumab  # Depression and Anxiety - continue Desvenlafaxine  # Morbid Obesity - BMI 43.12 - complicates all aspects of care   DVT prophylaxis: Lovenox Code Status: Full Admission status: tele   Clydia Llano MD [facility] Pager [phone]  If 7PM-7AM, please contact night-coverage [email or web address] Password [facility]  02/18/2019, 10:19 PM

## 2019-02-18 NOTE — Progress Notes (Signed)
    Kathryn Holloway, is a 39 y.o. female, DOB - [birth year], ZDG:[id number]  Who has underlying history of obesity, diet controlled DM type II, works as a Lawyer, was diagnosed with COVID-19 infection few days ago, now presented to the ER with exertional shortness of breath, overall appears to have mild disease, will be started on IV steroids, remdesivir, sliding scale, check A1c, rule out pregnancy.  Admit to MedSurg bed.   Vitals:   02/18/19 1420 02/18/19 1637 02/18/19 1700  BP: (!) 150/94 (!) 139/91 128/83  Pulse: 78 73 63  Resp: (!) 28 (!) 22 (!) 23  Temp: 98.5 F (36.9 C)    TempSrc: Oral    SpO2: 94% 96% 97%  Weight: 132.5 kg    Height: 5\' 9"  (1.753 m)          Data Review   Micro Results No results found for this or any previous visit (from the past 240 hour(s)).  Radiology Reports DG Chest Portable 1 View  Result Date: 02/18/2019 CLINICAL DATA:  Shortness of breath.  COVID positive. EXAM: PORTABLE CHEST 1 VIEW COMPARISON:  No prior. FINDINGS: Mediastinum is normal. Heart size normal. Diffuse multifocal bilateral pulmonary infiltrates. No pleural effusion or pneumothorax. No acute bony abnormality. IMPRESSION: Diffuse multifocal bilateral pulmonary infiltrates. Electronically Signed   By: 02/20/2019  Register   On: 02/18/2019 14:44    CBC Recent Labs  Lab 02/18/19 1614  WBC 6.8  HGB 13.2  HCT 41.7  PLT 251  MCV 90.5  MCH 28.6  MCHC 31.7  RDW 12.9  LYMPHSABS 2.2  MONOABS 0.5  EOSABS 0.0  BASOSABS 0.0    Chemistries  Recent Labs  Lab 02/18/19 1614  NA 137  K 3.5  CL 104  CO2 22  GLUCOSE 141*  BUN 9  CREATININE 0.54  CALCIUM 8.9  AST 42*  ALT 23  ALKPHOS 68  BILITOT 0.4   ------------------------------------------------------------------------------------------------------------------ estimated creatinine clearance is 139.5 mL/min (by C-G formula based on SCr of 0.54  mg/dL). ------------------------------------------------------------------------------------------------------------------ No results for input(s): HGBA1C in the last 72 hours. ------------------------------------------------------------------------------------------------------------------ No results for input(s): CHOL, HDL, LDLCALC, TRIG, CHOLHDL, LDLDIRECT in the last 72 hours. ------------------------------------------------------------------------------------------------------------------ No results for input(s): TSH, T4TOTAL, T3FREE, THYROIDAB in the last 72 hours.  Invalid input(s): FREET3 ------------------------------------------------------------------------------------------------------------------ No results for input(s): VITAMINB12, FOLATE, FERRITIN, TIBC, IRON, RETICCTPCT in the last 72 hours.  Coagulation profile No results for input(s): INR, PROTIME in the last 168 hours.  Recent Labs    02/18/19 1614  DDIMER 0.91*    Cardiac Enzymes No results for input(s): CKMB, TROPONINI, MYOGLOBIN in the last 168 hours.  Invalid input(s): CK ------------------------------------------------------------------------------------------------------------------ Invalid input(s): POCBNP

## 2019-02-18 NOTE — ED Provider Notes (Signed)
[facility] EMERGENCY DEPARTMENT Provider Note   CSN: [id number] Arrival date & time: 02/18/19  1410     History Chief Complaint  Patient presents with  . Shortness of Breath    Kathryn Holloway is a 39 y.o. female.  39yo female presents with complaint of SHOB and cough x 3 days.  Patient was seen at [facility] and had a positive COVID test on 02/11/19, symptom onset 02/09/19, symptoms have gradually improved however SHOB is worsening. Patient was given an albuterol inhaler to use as well as tessalon, also taking Symbicort, patient had a 5mg  prednisone taper (starting at 20mg ) at home that she is taking. Patient reports monitoring O2 sat at home, is in the 90s while at rest however drops to the 80s with any exertion and feels severely SHOB. Fever resolved 2 days ago. Patient has a history of asthma and diabetes (states diet controlled). No other complaints or concerns.         Past Medical History:  Diagnosis Date  . ADHD   . Anxiety   . Hypertension     Patient Active Problem List   Diagnosis Date Noted  . COVID-19 virus infection 02/18/2019    Past Surgical History:  Procedure Laterality Date  . APPENDECTOMY    . CESAREAN SECTION    . CHOLECYSTECTOMY    . TONSILLECTOMY       OB History   No obstetric history on file.     No family history on file.  Social History   Tobacco Use  . Smoking status: Never Smoker  . Smokeless tobacco: Never Used  Substance Use Topics  . Alcohol use: Never  . Drug use: Never    Home Medications Prior to Admission medications   Medication Sig Start Date End Date Taking? Authorizing Provider  amphetamine-dextroamphetamine (ADDERALL XR) 30 MG 24 hr capsule Take by mouth. 12/07/17   [provider]  desvenlafaxine (PRISTIQ) 100 MG 24 hr tablet Take 100 mg by mouth daily.    [provider]  HYDROcodone-acetaminophen (NORCO/VICODIN) 5-325 MG tablet Take 1-2 tablets by mouth every 6 (six) hours as needed for  severe pain. 01/29/18   Joy, Shawn C, PA-C  lisdexamfetamine (VYVANSE) 70 MG capsule Take 70 mg by mouth daily.    [provider]  morphine (MSIR) 15 MG tablet Take 1 tablet (15 mg total) by mouth every 4 (four) hours as needed for severe pain. 09/10/17   01/31/18, DO  Nebivolol HCl (BYSTOLIC PO) Take by mouth.    [provider]  omalizumab 11/10/17) 150 MG injection Inject into the skin. 08/17/17   [provider]  ondansetron (ZOFRAN ODT) 4 MG disintegrating tablet Take 1 tablet (4 mg total) by mouth every 8 (eight) hours as needed for nausea or vomiting. 01/29/18   Joy, Shawn C, PA-C    Allergies    Amoxicillin and Sulfa antibiotics  Review of Systems   Review of Systems  Constitutional: Negative for chills, diaphoresis and fever.  HENT: Negative for congestion and sore throat.   Respiratory: Positive for cough and shortness of breath. Negative for wheezing.   Cardiovascular: Negative for chest pain.  Gastrointestinal: Negative for abdominal pain, constipation, diarrhea, nausea and vomiting.  Musculoskeletal: Negative for arthralgias and myalgias.  Skin: Negative for rash and wound.  Allergic/Immunologic: Positive for immunocompromised state.  Neurological: Negative for weakness.  Hematological: Negative for adenopathy.  Psychiatric/Behavioral: Negative for confusion.  All other systems reviewed and are negative.   Physical Exam Updated  Vital Signs BP 128/83   Pulse 63   Temp 98.5 F (36.9 C) (Oral)   Resp (!) 23   Ht 5\' 9"  (1.753 m)   Wt 132.5 kg   SpO2 97%   BMI 43.12 kg/m   Physical Exam Vitals and nursing note reviewed.  Constitutional:      General: She is not in acute distress.    Appearance: She is well-developed. She is obese. She is not diaphoretic.  HENT:     Head: Normocephalic and atraumatic.  Cardiovascular:     Rate and Rhythm: Regular rhythm. Tachycardia present.     Comments: Rate 111 on arrival in the room. Pulmonary:      Effort: Tachypnea present. No respiratory distress.     Breath sounds: No decreased breath sounds or wheezing.     Comments: Course lung sounds in the bases  Musculoskeletal:     Right lower leg: No edema.     Left lower leg: No edema.  Skin:    General: Skin is warm and dry.     Findings: No erythema.  Neurological:     Mental Status: She is alert and oriented to person, place, and time.  Psychiatric:        Behavior: Behavior normal.     ED Results / Procedures / Treatments   Labs (all labs ordered are listed, but only abnormal results are displayed) Labs Reviewed  CBC WITH DIFFERENTIAL/PLATELET - Abnormal; Notable for the following components:      Result Value   Abs Immature Granulocytes 0.12 (*)    All other components within normal limits  COMPREHENSIVE METABOLIC PANEL - Abnormal; Notable for the following components:   Glucose, Bld 141 (*)    Total Protein 8.3 (*)    AST 42 (*)    All other components within normal limits  D-DIMER, QUANTITATIVE (NOT AT [facility]) - Abnormal; Notable for the following components:   D-Dimer, Quant 0.91 (*)    All other components within normal limits  CULTURE, BLOOD (ROUTINE X 2)  CULTURE, BLOOD (ROUTINE X 2)  LACTIC ACID, PLASMA  LACTIC ACID, PLASMA  PROCALCITONIN  LACTATE DEHYDROGENASE  FERRITIN  TRIGLYCERIDES  FIBRINOGEN  C-REACTIVE PROTEIN  PREGNANCY, URINE  HEMOGLOBIN A1C    EKG None  Radiology DG Chest Portable 1 View  Result Date: 02/18/2019 CLINICAL DATA:  Shortness of breath.  COVID positive. EXAM: PORTABLE CHEST 1 VIEW COMPARISON:  No prior. FINDINGS: Mediastinum is normal. Heart size normal. Diffuse multifocal bilateral pulmonary infiltrates. No pleural effusion or pneumothorax. No acute bony abnormality. IMPRESSION: Diffuse multifocal bilateral pulmonary infiltrates. Electronically Signed   By: 02/20/2019  Register   On: 02/18/2019 14:44    Procedures Procedures (including critical care time)  Medications Ordered  in ED Medications  methylPREDNISolone sodium succinate (SOLU-MEDROL) 125 mg/2 mL injection 60 mg (has no administration in time range)  lactated ringers infusion (has no administration in time range)  insulin aspart (novoLOG) injection 0-9 Units (has no administration in time range)  insulin aspart (novoLOG) injection 0-5 Units (has no administration in time range)  remdesivir 100 mg in sodium chloride 0.9 % 100 mL IVPB (has no administration in time range)  remdesivir 100 mg in sodium chloride 0.9 % 100 mL IVPB (has no administration in time range)    ED Course  I have reviewed the triage vital signs and the nursing notes.  Pertinent labs & imaging results that were available during my care of the patient were reviewed by me and  considered in my medical decision making (see chart for details).  Clinical Course as of Feb 17 1750  Tue Feb 17, 8133  3489 39 year old female presents with complaint of shortness of breath worsening over the past 3 days.  Patient was recently diagnosed with COVID-19, has been using her Symbicort and albuterol as well as a prednisone taper starting at 20 mg.  Patient states that her general Covid symptoms have resolved however she continues to have worsening shortness of breath.  Patient is monitoring her O2 at home, states she is in the 90s on room air but drops into the 80s and becomes severely short of breath with any exertion.  Patient ambulated to the room and was placed on pulse ox, noted to be tachypneic, tachycardic, room air O2 sat of 88% and was placed on 2 L via nasal cannula with improvement to 95%.  Chest x-ray is consistent with COVID-19 infection.  Plan is to order baseline labs, may consider admission for this patient.   [LM]  1719 Discussed with Dr. Alvino Chapel, ED attending, plan is to consult hospitalist for admission for new oxygen requirement.    [LM]  1751 Case discussed with Dr. Candiss Norse with [facility] who will put in orders for admission to a  MedSurg bed.   [LM]    Clinical Course User Index [LM] Roque Lias   MDM Rules/Calculators/A&P                     Final Clinical Impression(s) / ED Diagnoses Final diagnoses:  COVID-19  Hypoxemia    Rx / DC Orders ED Discharge Orders    None       Roque Lias 02/18/19 1752    Davonna Belling, MD 02/18/19 2322

## 2019-02-18 NOTE — ED Triage Notes (Addendum)
Pt c/o SOB x 1 week-states she was +covid 1 week ago-last used ventolin inhaler 1 hour PTA-pt with DOE noted

## 2019-02-19 ENCOUNTER — Encounter (HOSPITAL_COMMUNITY): Payer: Self-pay | Admitting: Internal Medicine

## 2019-02-19 LAB — D-DIMER, QUANTITATIVE: D-Dimer, Quant: 0.77 ug/mL-FEU — ABNORMAL HIGH (ref 0.00–0.50)

## 2019-02-19 LAB — CBC WITH DIFFERENTIAL/PLATELET
Abs Immature Granulocytes: 0.08 10*3/uL — ABNORMAL HIGH (ref 0.00–0.07)
Basophils Absolute: 0 10*3/uL (ref 0.0–0.1)
Basophils Relative: 1 %
Eosinophils Absolute: 0 10*3/uL (ref 0.0–0.5)
Eosinophils Relative: 0 %
HCT: 38.5 % (ref 36.0–46.0)
Hemoglobin: 12.4 g/dL (ref 12.0–15.0)
Immature Granulocytes: 1 %
Lymphocytes Relative: 25 %
Lymphs Abs: 1.4 10*3/uL (ref 0.7–4.0)
MCH: 29 pg (ref 26.0–34.0)
MCHC: 32.2 g/dL (ref 30.0–36.0)
MCV: 90.2 fL (ref 80.0–100.0)
Monocytes Absolute: 0.1 10*3/uL (ref 0.1–1.0)
Monocytes Relative: 2 %
Neutro Abs: 4 10*3/uL (ref 1.7–7.7)
Neutrophils Relative %: 71 %
Platelets: 265 10*3/uL (ref 150–400)
RBC: 4.27 MIL/uL (ref 3.87–5.11)
RDW: 12.9 % (ref 11.5–15.5)
WBC: 5.6 10*3/uL (ref 4.0–10.5)
nRBC: 0 % (ref 0.0–0.2)

## 2019-02-19 LAB — COMPREHENSIVE METABOLIC PANEL
ALT: 25 U/L (ref 0–44)
AST: 38 U/L (ref 15–41)
Albumin: 3.4 g/dL — ABNORMAL LOW (ref 3.5–5.0)
Alkaline Phosphatase: 68 U/L (ref 38–126)
Anion gap: 12 (ref 5–15)
BUN: 11 mg/dL (ref 6–20)
CO2: 24 mmol/L (ref 22–32)
Calcium: 8.8 mg/dL — ABNORMAL LOW (ref 8.9–10.3)
Chloride: 101 mmol/L (ref 98–111)
Creatinine, Ser: 0.58 mg/dL (ref 0.44–1.00)
GFR calc Af Amer: >60 mL/min (ref >60)
GFR calc non Af Amer: >60 mL/min (ref >60)
Glucose, Bld: 218 mg/dL — ABNORMAL HIGH (ref 70–99)
Potassium: 4 mmol/L (ref 3.5–5.1)
Sodium: 137 mmol/L (ref 135–145)
Total Bilirubin: 0.4 mg/dL (ref 0.3–1.2)
Total Protein: 7.8 g/dL (ref 6.5–8.1)

## 2019-02-19 LAB — GLUCOSE, CAPILLARY
Glucose-Capillary: 172 mg/dL — ABNORMAL HIGH (ref 70–99)
Glucose-Capillary: 179 mg/dL — ABNORMAL HIGH (ref 70–99)
Glucose-Capillary: 206 mg/dL — ABNORMAL HIGH (ref 70–99)
Glucose-Capillary: 228 mg/dL — ABNORMAL HIGH (ref 70–99)
Glucose-Capillary: 240 mg/dL — ABNORMAL HIGH (ref 70–99)

## 2019-02-19 LAB — MAGNESIUM: Magnesium: 2 mg/dL (ref 1.7–2.4)

## 2019-02-19 LAB — ABO/RH: ABO/RH(D): A NEG

## 2019-02-19 LAB — C-REACTIVE PROTEIN: CRP: 2 mg/dL — ABNORMAL HIGH (ref <1.0)

## 2019-02-19 LAB — PHOSPHORUS: Phosphorus: 2.9 mg/dL (ref 2.5–4.6)

## 2019-02-19 LAB — HIV ANTIBODY (ROUTINE TESTING W REFLEX): HIV Screen 4th Generation wRfx: NONREACTIVE

## 2019-02-19 LAB — FERRITIN: Ferritin: 160 ng/mL (ref 11–307)

## 2019-02-19 MED ORDER — HYDROCODONE-ACETAMINOPHEN 5-325 MG PO TABS: 1.0000 | ORAL_TABLET | Freq: Four times a day (QID) | ORAL | Status: DC | PRN

## 2019-02-19 MED ORDER — NEBIVOLOL HCL 5 MG PO TABS: 5.0000 mg | ORAL_TABLET | Freq: Every day | ORAL | Status: DC

## 2019-02-19 MED ORDER — DESVENLAFAXINE SUCCINATE ER 100 MG PO TB24
100.0000 mg | ORAL_TABLET | Freq: Every day | ORAL | Status: DC
  Administered 2019-02-19 – 2019-02-20 (×2): 100 mg via ORAL
  Filled 2019-02-19 (×2): qty 1

## 2019-02-19 MED ORDER — AMPHETAMINE-DEXTROAMPHET ER 10 MG PO CP24: 10.0000 mg | ORAL_CAPSULE | Freq: Every day | ORAL | Status: DC

## 2019-02-19 MED ORDER — AMPHETAMINE-DEXTROAMPHET ER 10 MG PO CP24
30.0000 mg | ORAL_CAPSULE | Freq: Every day | ORAL | Status: DC
  Administered 2019-02-20: 10:00:00 30 mg via ORAL
  Filled 2019-02-19: qty 3

## 2019-02-19 MED ORDER — NEBIVOLOL HCL 10 MG PO TABS
10.0000 mg | ORAL_TABLET | Freq: Every day | ORAL | Status: DC
  Administered 2019-02-19 – 2019-02-20 (×2): 10 mg via ORAL
  Filled 2019-02-19 (×2): qty 1

## 2019-02-19 MED ORDER — LINAGLIPTIN 5 MG PO TABS
5.0000 mg | ORAL_TABLET | Freq: Every day | ORAL | Status: DC
  Administered 2019-02-19 – 2019-02-20 (×2): 5 mg via ORAL
  Filled 2019-02-19 (×2): qty 1

## 2019-02-19 NOTE — Progress Notes (Signed)
PROGRESS NOTE                                                                                                                                                                                                             Patient Demographics:    Kathryn Holloway, is a 39 y.o. female, DOB - 1980-10-31, XUX:[id number]  Outpatient Primary MD for the patient is Karleen Hampshire., MD    LOS - 1  Admit date - 02/18/2019    Chief Complaint  Patient presents with  . Shortness of Breath       Brief Narrative  Jilliann Subramanian is a 39 y.o. female with medical history significant of Morbid Obesity, T2DM, ADHD, Anxiety, and HTN who presents with worsening SOB, she was recently diagnosed with COVID-19 pneumonia.   Subjective:    Rich Brave today has, No headache, No chest pain, No abdominal pain - No Nausea, No new weakness tingling or numbness, no  SOB.     Assessment  & Plan :     1. Acute Hypoxic Resp. Failure due to Acute Covid 19 Viral Pneumonitis during the ongoing 2020 Covid 19 Pandemic - mild disease at best, currently on bed but likely due to undiagnosed OSA along with acute hypoventilation syndrome.  In no distress.  Stable inflammatory markers.  Continue steroids and remdesivir, advance activity, titrate down oxygen and steroids.  Encouraged the patient to sit up in chair in the daytime use I-S and flutter valve for pulmonary toiletry and then prone in bed when at night.    SpO2: 90 % O2 Flow Rate (L/min): 2 L/min  Recent Labs  Lab 02/18/19 1614 02/19/19 0322  CRP 2.5* 2.0*  DDIMER 0.91* 0.77*  FERRITIN 174 160  PROCALCITON <0.10  --     Hepatic Function Latest Ref Rng & Units 02/19/2019 02/18/2019  Total Protein 6.5 - 8.1 g/dL 7.8 8.3(H)  Albumin 3.5 - 5.0 g/dL 3.4(L) 3.7  AST 15 - 41 U/L 38 42(H)  ALT 0 - 44 U/L 25 23  Alk Phosphatase 38 - 126 U/L 68 68  Total Bilirubin 0.3 - 1.2 mg/dL 0.4 0.4    #  Asthma - continue inhalers, steroids as above.  Mild exac but in setting of COVID  # T2DM - Diet-controlled per patient, A1c now however is 7.1 - ISS and CBG  monitoring during stay - will add linagliptin (also out-patient notes suggest she had been on sitagliptin)  Lab Results  Component Value Date   HGBA1C 7.1 (H) 02/18/2019   CBG (last 3)  Recent Labs    02/19/19 0010 02/19/19 0917  GLUCAP 240* 206*     # ADHD - continue Adderall  # HTN - on nebivolol, per patient this was chosen due to concurrent hx of migraines  # Migraines - proph with BB - triptan prn  # Chronic idiopathic urticaria - follows with allergy, on omalizumab  # Depression and Anxiety - continue Desvenlafaxine  # Morbid Obesity - BMI 14.97 - complicates all aspects of care     Condition - Fair  Family Communication  :  None  Code Status :  Full  Diet :   Diet Order            Diet Carb Modified Fluid consistency: Thin; Room service appropriate? Yes  Diet effective now               Disposition Plan  :  Home  Consults  :  None  Procedures  :  None  PUD Prophylaxis :   DVT Prophylaxis  :  Lovenox    Lab Results  Component Value Date   PLT 265 02/19/2019    Inpatient Medications  Scheduled Meds: . amphetamine-dextroamphetamine  30 mg Oral Daily  . desvenlafaxine  100 mg Oral Daily  . dexamethasone (DECADRON) injection  6 mg Intravenous Q24H  . enoxaparin (LOVENOX) injection  65 mg Subcutaneous Q24H  . insulin aspart  0-5 Units Subcutaneous QHS  . insulin aspart  0-9 Units Subcutaneous TID WC  . Ipratropium-Albuterol  1 puff Inhalation Q6H  . linagliptin  5 mg Oral Daily  . nebivolol  10 mg Oral Daily   Continuous Infusions: . sodium chloride 250 mL (02/18/19 1911)  . remdesivir 100 mg in NS 100 mL 100 mg (02/19/19 0935)   PRN Meds:.sodium chloride, acetaminophen, chlorpheniramine-HYDROcodone, guaiFENesin-dextromethorphan, HYDROcodone-acetaminophen,  senna-docusate  Antibiotics  :    Anti-infectives (From admission, onward)   Start     Dose/Rate Route Frequency Ordered Stop   02/19/19 1000  remdesivir 100 mg in sodium chloride 0.9 % 100 mL IVPB     100 mg 200 mL/hr over 30 Minutes Intravenous Daily 02/18/19 1749 02/23/19 0959   02/18/19 1800  remdesivir 100 mg in sodium chloride 0.9 % 100 mL IVPB     100 mg 200 mL/hr over 30 Minutes Intravenous Every hour 02/18/19 1749 02/18/19 2118       Time Spent in minutes  30   Lala Lund M.D on 02/19/2019 at 10:36 AM  To page go to [email or web address] - password [facility]  [facility] -  Office  [phone]     See all Orders from today for further details    Objective:   Vitals:   02/18/19 2241 02/19/19 0000 02/19/19 0451 02/19/19 0900  BP: 120/69 117/63 117/72 118/60  Pulse: 65 66 (!) 59 (!) 59  Resp: '20 20 20 18  ' Temp: 98.3 F (36.8 C) 98.4 F (36.9 C)    TempSrc: Oral Oral    SpO2: 94% 92% 92% 90%  Weight:      Height:        Wt Readings from Last 3 Encounters:  02/18/19 132.5 kg  01/29/18 136.1 kg  09/10/17 136.1 kg     Intake/Output Summary (Last 24 hours) at 02/19/2019 1036 Last data filed at 02/19/2019 0400  Gross per 24 hour  Intake 1071.23 ml  Output --  Net 1071.23 ml     Physical Exam  Awake Alert, No new F.N deficits, Normal affect [place].AT,PERRAL Supple Neck,No JVD, No cervical lymphadenopathy appriciated.  Symmetrical Chest wall movement, Good air movement bilaterally, CTAB RRR,No Gallops,Rubs or new Murmurs, No Parasternal Heave +ve B.Sounds, Abd Soft, No tenderness, No organomegaly appriciated, No rebound - guarding or rigidity. No Cyanosis, Clubbing or edema, No new Rash or bruise       Data Review:    CBC Recent Labs  Lab 02/18/19 1614 02/19/19 0322  WBC 6.8 5.6  HGB 13.2 12.4  HCT 41.7 38.5  PLT 251 265  MCV 90.5 90.2  MCH 28.6 29.0  MCHC 31.7 32.2  RDW 12.9 12.9  LYMPHSABS 2.2 1.4  MONOABS 0.5 0.1  EOSABS 0.0 0.0   BASOSABS 0.0 0.0    Chemistries  Recent Labs  Lab 02/18/19 1614 02/19/19 0322  NA 137 137  K 3.5 4.0  CL 104 101  CO2 22 24  GLUCOSE 141* 218*  BUN 9 11  CREATININE 0.54 0.58  CALCIUM 8.9 8.8*  MG  --  2.0  AST 42* 38  ALT 23 25  ALKPHOS 68 68  BILITOT 0.4 0.4   ------------------------------------------------------------------------------------------------------------------ Recent Labs    02/18/19 1614  TRIG 245*    Lab Results  Component Value Date   HGBA1C 7.1 (H) 02/18/2019   ------------------------------------------------------------------------------------------------------------------ No results for input(s): TSH, T4TOTAL, T3FREE, THYROIDAB in the last 72 hours.  Invalid input(s): FREET3  Cardiac Enzymes No results for input(s): CKMB, TROPONINI, MYOGLOBIN in the last 168 hours.  Invalid input(s): CK ------------------------------------------------------------------------------------------------------------------ No results found for: BNP  Micro Results Recent Results (from the past 240 hour(s))  Blood Culture (routine x 2)     Status: None (Preliminary result)   Collection Time: 02/18/19  4:20 PM   Specimen: BLOOD  Result Value Ref Range Status   Specimen Description   Final    BLOOD BLOOD RIGHT ARM Performed at [facility], [address] [place], [place] [address]    Special Requests   Final    BOTTLES DRAWN AEROBIC AND ANAEROBIC Blood Culture adequate volume Performed at [facility], [address] [place], [place] [address]    Culture   Final    NO GROWTH < 24 HOURS Performed at [facility], [place] [address] [place], [place] [address]    Report Status PENDING  Incomplete  Blood Culture (routine x 2)     Status: None (Preliminary result)   Collection Time: 02/18/19  4:35 PM   Specimen: BLOOD  Result Value Ref Range Status   Specimen Description   Final    BLOOD BLOOD LEFT ARM Performed at [facility], [address] [place], [place] [address]    Special Requests   Final    BOTTLES DRAWN AEROBIC AND ANAEROBIC Blood Culture adequate volume Performed at [facility], [address] [place], [place] [address]    Culture   Final    NO GROWTH < 24 HOURS Performed at [facility], [place] [address] [place], [place] [address]    Report Status PENDING  Incomplete    Radiology Reports DG Chest Portable 1 View  Result Date: 02/18/2019 CLINICAL DATA:  Shortness of breath.  COVID positive. EXAM: PORTABLE CHEST 1 VIEW COMPARISON:  No prior. FINDINGS: Mediastinum is normal. Heart size normal.  Diffuse multifocal bilateral pulmonary infiltrates. No pleural effusion or pneumothorax. No acute bony abnormality. IMPRESSION: Diffuse multifocal bilateral pulmonary infiltrates. Electronically Signed   By: Marcello Moores  Register   On: 02/18/2019 14:44

## 2019-02-19 NOTE — Plan of Care (Signed)
Pt A&Ox4. VSS, SpO2 >88% on RA. Ambulated around unit x1 on RA, tolerated well. Min sat 87%, able to recover independently w/o oxygen. Bgs stable requiring sliding scale for coverage  Ambulating independently to bathroom unassisted.  Will report to oncoming shift  Berneice Heinrich    Problem: Education: Goal: Knowledge of risk factors and measures for prevention of condition will improve Outcome: Progressing   Problem: Coping: Goal: Psychosocial and spiritual needs will be supported Outcome: Progressing   Problem: Respiratory: Goal: Will maintain a patent airway Outcome: Progressing Goal: Complications related to the disease process, condition or treatment will be avoided or minimized Outcome: Progressing

## 2019-02-20 LAB — COMPREHENSIVE METABOLIC PANEL
ALT: 19 U/L (ref 0–44)
AST: 21 U/L (ref 15–41)
Albumin: 3.3 g/dL — ABNORMAL LOW (ref 3.5–5.0)
Alkaline Phosphatase: 56 U/L (ref 38–126)
Anion gap: 11 (ref 5–15)
BUN: 20 mg/dL (ref 6–20)
CO2: 25 mmol/L (ref 22–32)
Calcium: 8.8 mg/dL — ABNORMAL LOW (ref 8.9–10.3)
Chloride: 101 mmol/L (ref 98–111)
Creatinine, Ser: 0.51 mg/dL (ref 0.44–1.00)
GFR calc Af Amer: >60 mL/min (ref >60)
GFR calc non Af Amer: >60 mL/min (ref >60)
Glucose, Bld: 238 mg/dL — ABNORMAL HIGH (ref 70–99)
Potassium: 4 mmol/L (ref 3.5–5.1)
Sodium: 137 mmol/L (ref 135–145)
Total Bilirubin: 0.5 mg/dL (ref 0.3–1.2)
Total Protein: 7.1 g/dL (ref 6.5–8.1)

## 2019-02-20 LAB — CBC WITH DIFFERENTIAL/PLATELET
Abs Immature Granulocytes: 0.16 10*3/uL — ABNORMAL HIGH (ref 0.00–0.07)
Basophils Absolute: 0 10*3/uL (ref 0.0–0.1)
Basophils Relative: 1 %
Eosinophils Absolute: 0 10*3/uL (ref 0.0–0.5)
Eosinophils Relative: 0 %
HCT: 38.1 % (ref 36.0–46.0)
Hemoglobin: 12.2 g/dL (ref 12.0–15.0)
Immature Granulocytes: 2 %
Lymphocytes Relative: 24 %
Lymphs Abs: 2.1 10*3/uL (ref 0.7–4.0)
MCH: 29 pg (ref 26.0–34.0)
MCHC: 32 g/dL (ref 30.0–36.0)
MCV: 90.7 fL (ref 80.0–100.0)
Monocytes Absolute: 0.6 10*3/uL (ref 0.1–1.0)
Monocytes Relative: 6 %
Neutro Abs: 5.9 10*3/uL (ref 1.7–7.7)
Neutrophils Relative %: 67 %
Platelets: 281 10*3/uL (ref 150–400)
RBC: 4.2 MIL/uL (ref 3.87–5.11)
RDW: 12.9 % (ref 11.5–15.5)
WBC: 8.8 10*3/uL (ref 4.0–10.5)
nRBC: 0 % (ref 0.0–0.2)

## 2019-02-20 LAB — MAGNESIUM: Magnesium: 2 mg/dL (ref 1.7–2.4)

## 2019-02-20 LAB — GLUCOSE, CAPILLARY
Glucose-Capillary: 256 mg/dL — ABNORMAL HIGH (ref 70–99)
Glucose-Capillary: 252 mg/dL — ABNORMAL HIGH (ref 70–99)

## 2019-02-20 LAB — BRAIN NATRIURETIC PEPTIDE: B Natriuretic Peptide: 62.7 pg/mL (ref 0.0–100.0)

## 2019-02-20 LAB — D-DIMER, QUANTITATIVE: D-Dimer, Quant: 0.42 ug/mL-FEU (ref 0.00–0.50)

## 2019-02-20 LAB — C-REACTIVE PROTEIN: CRP: 1.1 mg/dL — ABNORMAL HIGH (ref <1.0)

## 2019-02-20 MED ORDER — METHYLPREDNISOLONE 4 MG PO TBPK: ORAL_TABLET | ORAL | 0 refills | Status: AC

## 2019-02-20 NOTE — Discharge Summary (Signed)
Kathryn Holloway GZF:[id number] DOB: Oct 19, 1980 DOA: 02/18/2019  PCP: Karleen Hampshire., MD  Admit date: 02/18/2019  Discharge date: 02/20/2019  Admitted From: Home   Disposition:  Home   Recommendations for Outpatient Follow-up:   Follow up with PCP in 1-2 weeks  PCP Please obtain BMP/CBC, 2 view CXR in 1week,  (see Discharge instructions)   PCP Please follow up on the following pending results:    Home Health: None   Equipment/Devices: None  Consultations: None  Discharge Condition: Stable    CODE STATUS: Full    Diet Recommendation: Heart Healthy Low Carb     Chief Complaint  Patient presents with  . Shortness of Breath     Brief history of present illness from the day of admission and additional interim summary    Kathryn Motsingeris a 39 y.o.femalewith medical history significant ofMorbid Obesity, T2DM, ADHD, Anxiety, and HTN who presents with worsening SOB, she was recently diagnosed with COVID-19 pneumonia.                                                                 Hospital Course    1. Acute Hypoxic Resp. Failure due to Acute Covid 19 Viral Pneumonitis during the ongoing 2020 Covid 19 Pandemic - mild disease at best, she was treated with steroids and remdesivir, she is now stable on room air for the last 24 hours and symptom-free, will get a oral steroid taper upon discharge along with 2 doses of outpatient remdesivir to complete her course, follow with PCP in a week post discharge.  Recent Labs  Lab 02/18/19 1614 02/19/19 0322 02/20/19 0415  CRP 2.5* 2.0* 1.1*  DDIMER 0.91* 0.77* 0.42  FERRITIN 174 160  --   BNP  --   --  62.7  PROCALCITON <0.10  --   --     Hepatic Function Latest Ref Rng & Units 02/20/2019 02/19/2019 02/18/2019  Total Protein 6.5 - 8.1 g/dL 7.1 7.8 8.3(H)    Albumin 3.5 - 5.0 g/dL 3.3(L) 3.4(L) 3.7  AST 15 - 41 U/L 21 38 42(H)  ALT 0 - 44 U/L '19 25 23  ' Alk Phosphatase 38 - 126 U/L 56 68 68  Total Bilirubin 0.3 - 1.2 mg/dL 0.5 0.4 0.4    # Asthma -Stable continue home medications no acute issues.  # T2DM - Diet-controlled per patient, A1c now however is 7.1 -With PCP for glycemic control and A1c monitoring   Discharge diagnosis     Active Problems:   COVID-19 virus infection   Acute hypoxemic respiratory failure due to COVID-19 [facility])    Discharge instructions    Discharge Instructions    Discharge instructions   Complete by: As directed    You are scheduled for an outpatient infusion of Remdesivir at 1PM on Friday 1/22  and Saturday 1/23.Marland Kitchen  Please report to Lottie Mussel at [address]  Drive to the security guard and tell them you are here for an infusion. They will direct you to the front entrance where we will come and get you.  For questions call [phone].  Thanks.   Follow with Primary MD Karleen Hampshire., MD in 7 days   Get CBC, CMP, 2 view Chest X ray -  checked next visit within 1 week by Primary MD  Activity: As tolerated with Full fall precautions use walker/cane & assistance as needed  Disposition Home    Diet: Heart Healthy Low Carb  Special Instructions: If you have smoked or chewed Tobacco  in the last 2 yrs please stop smoking, stop any regular Alcohol  and or any Recreational drug use.  On your next visit with your primary care physician please Get Medicines reviewed and adjusted.  Please request your Prim.MD to go over all Hospital Tests and Procedure/Radiological results at the follow up, please get all Hospital records sent to your Prim MD by signing hospital release before you go home.  If you experience worsening of your admission symptoms, develop shortness of breath, life threatening emergency, suicidal or homicidal thoughts you must seek medical attention immediately by calling 911 or  calling your MD immediately  if symptoms less severe.  You Must read complete instructions/literature along with all the possible adverse reactions/side effects for all the Medicines you take and that have been prescribed to you. Take any new Medicines after you have completely understood and accpet all the possible adverse reactions/side effects.   Increase activity slowly   Complete by: As directed    MyChart COVID-19 home monitoring program   Complete by: Feb 20, 2019    Is the patient willing to use the [place] for home monitoring?: Yes   Temperature monitoring   Complete by: Feb 20, 2019    After how many days would you like to receive a notification of this patient's flowsheet entries?: 1      Discharge Medications   Allergies as of 02/20/2019      Reactions   Amoxicillin    hives   Sulfa Antibiotics    hives      Medication List    STOP taking these medications   ibuprofen 200 MG tablet Commonly known as: ADVIL     TAKE these medications   albuterol 108 (90 Base) MCG/ACT inhaler Commonly known as: VENTOLIN HFA Inhale 1-2 puffs into the lungs every 6 (six) hours as needed for wheezing or shortness of breath (while sick w/ CoVid).   amphetamine-dextroamphetamine 30 MG 24 hr capsule Commonly known as: ADDERALL XR Take by mouth.   BYSTOLIC PO Take 10 mg by mouth daily.   desvenlafaxine 100 MG 24 hr tablet Commonly known as: PRISTIQ Take 100 mg by mouth daily.   methylPREDNISolone 4 MG Tbpk tablet Commonly known as: MEDROL DOSEPAK follow package directions   omalizumab 150 MG injection Commonly known as: XOLAIR Inject 300 mg into the skin every 28 (twenty-eight) days. Inject 155m (total of 305m into each arm every 4 weeks   ondansetron 4 MG disintegrating tablet Commonly known as: Zofran ODT Take 1 tablet (4 mg total) by mouth every 8 (eight) hours as needed for nausea or vomiting.       Follow-up Information    FuKarleen Hampshire MD. Schedule  an appointment as soon as possible for a visit in 1 week(s).  Specialty: Internal Medicine Contact information: [address] [place] [address] [phone]           Major procedures and Radiology Reports - PLEASE review detailed and final reports thoroughly  -        DG Chest Portable 1 View  Result Date: 02/18/2019 CLINICAL DATA:  Shortness of breath.  COVID positive. EXAM: PORTABLE CHEST 1 VIEW COMPARISON:  No prior. FINDINGS: Mediastinum is normal. Heart size normal. Diffuse multifocal bilateral pulmonary infiltrates. No pleural effusion or pneumothorax. No acute bony abnormality. IMPRESSION: Diffuse multifocal bilateral pulmonary infiltrates. Electronically Signed   By: Marcello Moores  Register   On: 02/18/2019 14:44    Micro Results     Recent Results (from the past 240 hour(s))  Blood Culture (routine x 2)     Status: None (Preliminary result)   Collection Time: 02/18/19  4:20 PM   Specimen: BLOOD  Result Value Ref Range Status   Specimen Description   Final    BLOOD BLOOD RIGHT ARM Performed at [facility], [address] [place], [place] [address]    Special Requests   Final    BOTTLES DRAWN AEROBIC AND ANAEROBIC Blood Culture adequate volume Performed at [facility], [address] [place], [place] [address]    Culture   Final    NO GROWTH 2 DAYS Performed at [facility], [place] [address], [place], [place] [address]    Report Status PENDING  Incomplete  Blood Culture (routine x 2)     Status: None (Preliminary result)   Collection Time: 02/18/19  4:35 PM   Specimen: BLOOD  Result Value Ref Range Status   Specimen Description   Final    BLOOD BLOOD LEFT ARM Performed at [facility], [address] [place], [place] [address]    Special Requests   Final    BOTTLES DRAWN AEROBIC AND ANAEROBIC Blood Culture adequate volume Performed at [facility], [address] [place], [place] [address]     Culture   Final    NO GROWTH 2 DAYS Performed at [facility], [place] [address], [place], [place] [address]    Report Status PENDING  Incomplete    Today   Subjective    Brenlyn Beshara today has no headache,no chest abdominal pain,no new weakness tingling or numbness, feels much better wants to go home today.     Objective   Blood pressure (!) 98/51, pulse 64, temperature 98.3 F (36.8 C), temperature source Oral, resp. rate 18, height '5\' 9"'  (1.753 m), weight 132.5 kg, SpO2 92 %.   Intake/Output Summary (Last 24 hours) at 02/20/2019 1035 Last data filed at 02/20/2019 1003 Gross per 24 hour  Intake 480 ml  Output --  Net 480 ml    Exam Awake Alert,  No new F.N deficits, Normal affect [place].AT,PERRAL Supple Neck,No JVD, No cervical lymphadenopathy appriciated.  Symmetrical Chest wall movement, Good air movement bilaterally, CTAB RRR,No Gallops,Rubs or new Murmurs, No Parasternal Heave +ve B.Sounds, Abd Soft, Non tender, No organomegaly appriciated, No rebound -guarding or rigidity. No Cyanosis, Clubbing or edema, No new Rash or bruise   Data Review   CBC w Diff:  Lab Results  Component Value Date   WBC 8.8 02/20/2019   HGB 12.2 02/20/2019   HCT 38.1 02/20/2019   PLT 281 02/20/2019   LYMPHOPCT 24 02/20/2019   MONOPCT 6 02/20/2019   EOSPCT 0  02/20/2019   BASOPCT 1 02/20/2019    CMP:  Lab Results  Component Value Date   NA 137 02/20/2019   K 4.0 02/20/2019   CL 101 02/20/2019   CO2 25 02/20/2019   BUN 20 02/20/2019   CREATININE 0.51 02/20/2019   PROT 7.1 02/20/2019   ALBUMIN 3.3 (L) 02/20/2019   BILITOT 0.5 02/20/2019   ALKPHOS 56 02/20/2019   AST 21 02/20/2019   ALT 19 02/20/2019  .   Total Time in preparing paper work, data evaluation and todays exam - 30 minutes  Lala Lund M.D on 02/20/2019 at 10:35 AM  [facility]   Office  [phone]

## 2019-02-20 NOTE — Discharge Instructions (Addendum)
You are scheduled for an outpatient infusion of Remdesivir at 1PM on Friday 1/22 and Saturday 1/23.Kathryn Holloway  Please report to [facility] at [address].  Drive to the security guard and tell them you are here for an infusion. They will direct you to the front entrance where we will come and get you.  For questions call [phone].  Thanks.   Follow with Primary MD Laqueta Due., MD in 7 days   Get CBC, CMP, 2 view Chest X ray -  checked next visit within 1 week by Primary MD  Activity: As tolerated with Full fall precautions use walker/cane & assistance as needed  Disposition Home    Diet: Heart Healthy Low Carb  Special Instructions: If you have smoked or chewed Tobacco  in the last 2 yrs please stop smoking, stop any regular Alcohol  and or any Recreational drug use.  On your next visit with your primary care physician please Get Medicines reviewed and adjusted.  Please request your Prim.MD to go over all Hospital Tests and Procedure/Radiological results at the follow up, please get all Hospital records sent to your Prim MD by signing hospital release before you go home.  If you experience worsening of your admission symptoms, develop shortness of breath, life threatening emergency, suicidal or homicidal thoughts you must seek medical attention immediately by calling 911 or calling your MD immediately  if symptoms less severe.  You Must read complete instructions/literature along with all the possible adverse reactions/side effects for all the Medicines you take and that have been prescribed to you. Take any new Medicines after you have completely understood and accpet all the possible adverse reactions/side effects.         Person Under Monitoring Name: [facility]  Location: [address] [place] [address]   Infection Prevention Recommendations for Individuals Confirmed to have, or Being Evaluated for, 2019 Novel Coronavirus (COVID-19) Infection  Who Receive Care at Home  Individuals who are confirmed to have, or are being evaluated for, COVID-19 should follow the prevention steps below until a healthcare provider or local or state health department says they can return to normal activities.  Stay home except to get medical care You should restrict activities outside your home, except for getting medical care. Do not go to work, school, or public areas, and do not use public transportation or taxis.  Call ahead before visiting your doctor Before your medical appointment, call the healthcare provider and tell them that you have, or are being evaluated for, COVID-19 infection. This will help the healthcare providers office take steps to keep other people from getting infected. Ask your healthcare provider to call the local or state health department.  Monitor your symptoms Seek prompt medical attention if your illness is worsening (e.g., difficulty breathing). Before going to your medical appointment, call the healthcare provider and tell them that you have, or are being evaluated for, COVID-19 infection. Ask your healthcare provider to call the local or state health department.  Wear a facemask You should wear a facemask that covers your nose and mouth when you are in the same room with other people and when you visit a healthcare provider. People who live with or visit you should also wear a facemask while they are in the same room with you.  Separate yourself from other people in your home As much as possible, you should stay in a different room from other people in your home. Also, you should  use a separate bathroom, if available.  Avoid sharing household items You should not share dishes, drinking glasses, cups, eating utensils, towels, bedding, or other items with other people in your home. After using these items, you should wash them thoroughly with soap and water.  Cover your coughs and sneezes Cover your mouth and  nose with a tissue when you cough or sneeze, or you can cough or sneeze into your sleeve. Throw used tissues in a lined trash can, and immediately wash your hands with soap and water for at least 20 seconds or use an alcohol-based hand rub.  Wash your [employer] your hands often and thoroughly with soap and water for at least 20 seconds. You can use an alcohol-based hand sanitizer if soap and water are not available and if your hands are not visibly dirty. Avoid touching your eyes, nose, and mouth with unwashed hands.   Prevention Steps for Caregivers and Household Members of Individuals Confirmed to have, or Being Evaluated for, COVID-19 Infection Being Cared for in the Home  If you live with, or provide care at home for, a person confirmed to have, or being evaluated for, COVID-19 infection please follow these guidelines to prevent infection:  Follow healthcare providers instructions Make sure that you understand and can help the patient follow any healthcare provider instructions for all care.  Provide for the patients basic needs You should help the patient with basic needs in the home and provide support for getting groceries, prescriptions, and other personal needs.  Monitor the patients symptoms If they are getting sicker, call his or her medical provider and tell them that the patient has, or is being evaluated for, COVID-19 infection. This will help the healthcare providers office take steps to keep other people from getting infected. Ask the healthcare provider to call the local or state health department.  Limit the number of people who have contact with the patient  If possible, have only one caregiver for the patient.  Other household members should stay in another home or place of residence. If this is not possible, they should stay  in another room, or be separated from the patient as much as possible. Use a separate bathroom, if available.  Restrict visitors  who do not have an essential need to be in the home.  Keep older adults, very young children, and other sick people away from the patient Keep older adults, very young children, and those who have compromised immune systems or chronic health conditions away from the patient. This includes people with chronic heart, lung, or kidney conditions, diabetes, and cancer.  Ensure good ventilation Make sure that shared spaces in the home have good air flow, such as from an air conditioner or an opened window, weather permitting.  Wash your hands often  Wash your hands often and thoroughly with soap and water for at least 20 seconds. You can use an alcohol based hand sanitizer if soap and water are not available and if your hands are not visibly dirty.  Avoid touching your eyes, nose, and mouth with unwashed hands.  Use disposable paper towels to dry your hands. If not available, use dedicated cloth towels and replace them when they become wet.  Wear a facemask and gloves  Wear a disposable facemask at all times in the room and gloves when you touch or have contact with the patients blood, body fluids, and/or secretions or excretions, such as sweat, saliva, sputum, nasal mucus, vomit, urine, or feces.  Ensure  the mask fits over your nose and mouth tightly, and do not touch it during use.  Throw out disposable facemasks and gloves after using them. Do not reuse.  Wash your hands immediately after removing your facemask and gloves.  If your personal clothing becomes contaminated, carefully remove clothing and launder. Wash your hands after handling contaminated clothing.  Place all used disposable facemasks, gloves, and other waste in a lined container before disposing them with other household waste.  Remove gloves and wash your hands immediately after handling these items.  Do not share dishes, glasses, or other household items with the patient  Avoid sharing household items. You should not  share dishes, drinking glasses, cups, eating utensils, towels, bedding, or other items with a patient who is confirmed to have, or being evaluated for, COVID-19 infection.  After the person uses these items, you should wash them thoroughly with soap and water.  Wash laundry thoroughly  Immediately remove and wash clothes or bedding that have blood, body fluids, and/or secretions or excretions, such as sweat, saliva, sputum, nasal mucus, vomit, urine, or feces, on them.  Wear gloves when handling laundry from the patient.  Read and follow directions on labels of laundry or clothing items and detergent. In general, wash and dry with the warmest temperatures recommended on the label.  Clean all areas the individual has used often  Clean all touchable surfaces, such as counters, tabletops, doorknobs, bathroom fixtures, toilets, phones, keyboards, tablets, and bedside tables, every day. Also, clean any surfaces that may have blood, body fluids, and/or secretions or excretions on them.  Wear gloves when cleaning surfaces the patient has come in contact with.  Use a diluted bleach solution (e.g., dilute bleach with 1 part bleach and 10 parts water) or a household disinfectant with a label that says EPA-registered for coronaviruses. To make a bleach solution at home, add 1 tablespoon of bleach to 1 quart (4 cups) of water. For a larger supply, add  cup of bleach to 1 gallon (16 cups) of water.  Read labels of cleaning products and follow recommendations provided on product labels. Labels contain instructions for safe and effective use of the cleaning product including precautions you should take when applying the product, such as wearing gloves or eye protection and making sure you have good ventilation during use of the product.  Remove gloves and wash hands immediately after cleaning.  Monitor yourself for signs and symptoms of illness Caregivers and household members are considered close  contacts, should monitor their health, and will be asked to limit movement outside of the home to the extent possible. Follow the monitoring steps for close contacts listed on the symptom monitoring form.   ? If you have additional questions, contact your local health department or call the epidemiologist on call at [phone] (available 24/7). ? This guidance is subject to change. For the most up-to-date guidance from [facility], please refer to their website: [email or web address]

## 2019-02-20 NOTE — Progress Notes (Signed)
Patient scheduled for outpatient Remdesivir infusion at 1PM on Friday 1/22 and Saturday 1/23. Please advise them to report to [facility] at [address]  Drive to the security guard and tell them you are here for an infusion. They will direct you to the front entrance where we will come and get you.  For questions call [phone].  Thanks

## 2019-02-20 NOTE — Plan of Care (Signed)
  Problem: Education: Goal: Knowledge of risk factors and measures for prevention of condition will improve Outcome: Progressing   Problem: Coping: Goal: Psychosocial and spiritual needs will be supported Outcome: Progressing   Problem: Respiratory: Goal: Will maintain a patent airway Outcome: Progressing Goal: Complications related to the disease process, condition or treatment will be avoided or minimized Outcome: Progressing   

## 2019-02-21 ENCOUNTER — Ambulatory Visit (HOSPITAL_COMMUNITY)
Admission: RE | Admit: 2019-02-21 | Discharge: 2019-02-21 | Disposition: A | Discharge status: Home / Self Care | Payer: PRIVATE HEALTH INSURANCE | Source: Ambulatory Visit | Attending: Pulmonary Disease | Admitting: Pulmonary Disease

## 2019-02-21 ENCOUNTER — Encounter (INDEPENDENT_AMBULATORY_CARE_PROVIDER_SITE_OTHER): Payer: Self-pay

## 2019-02-21 VITALS — BP 121/82 | HR 58 | Temp 97.8°F | Resp 16

## 2019-02-21 DIAGNOSIS — U071 COVID-19: Secondary | ICD-10-CM | POA: Insufficient documentation

## 2019-02-21 MED ORDER — FAMOTIDINE IN NACL 20-0.9 MG/50ML-% IV SOLN: 20.0000 mg | Freq: Once | INTRAVENOUS | Status: DC | PRN

## 2019-02-21 MED ORDER — METHYLPREDNISOLONE SODIUM SUCC 125 MG IJ SOLR: 125.0000 mg | Freq: Once | INTRAMUSCULAR | Status: DC | PRN

## 2019-02-21 MED ORDER — SODIUM CHLORIDE 0.9 % IV SOLN
INTRAVENOUS | Status: AC
  Administered 2019-02-21: 100 mg via INTRAVENOUS
  Filled 2019-02-21: qty 20

## 2019-02-21 MED ORDER — DIPHENHYDRAMINE HCL 50 MG/ML IJ SOLN: 50.0000 mg | Freq: Once | INTRAMUSCULAR | Status: DC | PRN

## 2019-02-21 MED ORDER — EPINEPHRINE 0.3 MG/0.3ML IJ SOAJ: 0.3000 mg | Freq: Once | INTRAMUSCULAR | Status: DC | PRN

## 2019-02-21 MED ORDER — SODIUM CHLORIDE 0.9 % IV SOLN: 100.0000 mg | Freq: Once | INTRAVENOUS | Status: AC

## 2019-02-21 MED ORDER — SODIUM CHLORIDE 0.9 % IV SOLN: INTRAVENOUS | Status: DC | PRN

## 2019-02-21 MED ORDER — ALBUTEROL SULFATE HFA 108 (90 BASE) MCG/ACT IN AERS: 2.0000 | INHALATION_SPRAY | Freq: Once | RESPIRATORY_TRACT | Status: DC | PRN

## 2019-02-21 NOTE — Progress Notes (Signed)
  Diagnosis: COVID-19  Physician:Dr Wright  Procedure: [facility] Med: remdesivir infusion.  Complications: No immediate complications noted.  Discharge: Discharged home   Kathryn Holloway 02/21/2019   

## 2019-02-21 NOTE — Discharge Instructions (Signed)
10 Things You Can Do to Manage Your COVID-19 Symptoms at Home If you have possible or confirmed COVID-19: 1. Stay home from work and school. And stay away from other public places. If you must go out, avoid using any kind of public transportation, ridesharing, or taxis. 2. Monitor your symptoms carefully. If your symptoms get worse, call your healthcare provider immediately. 3. Get rest and stay hydrated. 4. If you have a medical appointment, call the healthcare provider ahead of time and tell them that you have or may have COVID-19. 5. For medical emergencies, call 911 and notify the dispatch personnel that you have or may have COVID-19. 6. Cover your cough and sneezes with a tissue or use the inside of your elbow. 7. Wash your hands often with soap and water for at least 20 seconds or clean your hands with an alcohol-based hand sanitizer that contains at least 60% alcohol. 8. As much as possible, stay in a specific room and away from other people in your home. Also, you should use a separate bathroom, if available. If you need to be around other people in or outside of the home, wear a mask. 9. Avoid sharing personal items with other people in your household, like dishes, towels, and bedding. 10. Clean all surfaces that are touched often, like counters, tabletops, and doorknobs. Use household cleaning sprays or wipes according to the label instructions. cdc.gov/coronavirus 07/31/2018 This information is not intended to replace advice given to you by your health care provider. Make sure you discuss any questions you have with your health care provider. Document Revised: 01/02/2019 Document Reviewed: 01/02/2019 Elsevier Patient Education  2020 Else[employer] Inc.  

## 2019-02-22 ENCOUNTER — Ambulatory Visit (HOSPITAL_COMMUNITY)
Admit: 2019-02-22 | Discharge: 2019-02-22 | Disposition: A | Discharge status: Home / Self Care | Payer: PRIVATE HEALTH INSURANCE | Attending: Pulmonary Disease | Admitting: Pulmonary Disease

## 2019-02-22 ENCOUNTER — Encounter (INDEPENDENT_AMBULATORY_CARE_PROVIDER_SITE_OTHER): Payer: Self-pay

## 2019-02-22 DIAGNOSIS — U071 COVID-19: Secondary | ICD-10-CM | POA: Diagnosis not present

## 2019-02-22 MED ORDER — SODIUM CHLORIDE 0.9 % IV SOLN: INTRAVENOUS | Status: DC | PRN

## 2019-02-22 MED ORDER — EPINEPHRINE 0.3 MG/0.3ML IJ SOAJ: 0.3000 mg | Freq: Once | INTRAMUSCULAR | Status: DC | PRN

## 2019-02-22 MED ORDER — DIPHENHYDRAMINE HCL 50 MG/ML IJ SOLN: 50.0000 mg | Freq: Once | INTRAMUSCULAR | Status: DC | PRN

## 2019-02-22 MED ORDER — SODIUM CHLORIDE 0.9 % IV SOLN
INTRAVENOUS | Status: AC
  Administered 2019-02-22: 13:00:00 100 mg via INTRAVENOUS
  Filled 2019-02-22: qty 20

## 2019-02-22 MED ORDER — FAMOTIDINE IN NACL 20-0.9 MG/50ML-% IV SOLN: 20.0000 mg | Freq: Once | INTRAVENOUS | Status: DC | PRN

## 2019-02-22 MED ORDER — ALBUTEROL SULFATE HFA 108 (90 BASE) MCG/ACT IN AERS: 2.0000 | INHALATION_SPRAY | Freq: Once | RESPIRATORY_TRACT | Status: DC | PRN

## 2019-02-22 MED ORDER — METHYLPREDNISOLONE SODIUM SUCC 125 MG IJ SOLR: 125.0000 mg | Freq: Once | INTRAMUSCULAR | Status: DC | PRN

## 2019-02-22 MED ORDER — SODIUM CHLORIDE 0.9 % IV SOLN: 100.0000 mg | Freq: Once | INTRAVENOUS | Status: AC

## 2019-02-22 NOTE — Progress Notes (Signed)
  Diagnosis: COVID-19  Physician: Dr. Delford Field  Procedure: [facility] Infusion Clinic Med: remdesivir infusion.  Complications: No immediate complications noted.  Discharge: Discharged home   Sharon [place], [place] 02/22/2019

## 2019-02-22 NOTE — Discharge Instructions (Signed)
10 Things You Can Do to Manage Your COVID-19 Symptoms at Home If you have possible or confirmed COVID-19: 1. Stay home from work and school. And stay away from other public places. If you must go out, avoid using any kind of public transportation, ridesharing, or taxis. 2. Monitor your symptoms carefully. If your symptoms get worse, call your healthcare provider immediately. 3. Get rest and stay hydrated. 4. If you have a medical appointment, call the healthcare provider ahead of time and tell them that you have or may have COVID-19. 5. For medical emergencies, call 911 and notify the dispatch personnel that you have or may have COVID-19. 6. Cover your cough and sneezes with a tissue or use the inside of your elbow. 7. Wash your hands often with soap and water for at least 20 seconds or clean your hands with an alcohol-based hand sanitizer that contains at least 60% alcohol. 8. As much as possible, stay in a specific room and away from other people in your home. Also, you should use a separate bathroom, if available. If you need to be around other people in or outside of the home, wear a mask. 9. Avoid sharing personal items with other people in your household, like dishes, towels, and bedding. 10. Clean all surfaces that are touched often, like counters, tabletops, and doorknobs. Use household cleaning sprays or wipes according to the label instructions. cdc.gov/coronavirus 07/31/2018 This information is not intended to replace advice given to you by your health care provider. Make sure you discuss any questions you have with your health care provider. Document Revised: 01/02/2019 Document Reviewed: 01/02/2019 Elsevier Patient Education  2020 Else[employer] Inc.  

## 2019-02-23 LAB — CULTURE, BLOOD (ROUTINE X 2)
Culture: NO GROWTH
Culture: NO GROWTH
Special Requests: ADEQUATE
Special Requests: ADEQUATE

## 2019-02-25 ENCOUNTER — Encounter (INDEPENDENT_AMBULATORY_CARE_PROVIDER_SITE_OTHER): Payer: Self-pay

## 2019-03-11 ENCOUNTER — Other Ambulatory Visit: Payer: Self-pay

## 2020-10-23 IMAGING — CT CT RENAL STONE PROTOCOL
2 of 4 series · 16 of 46 positions shown, 18 images · non-contrast
Comparison: None.

CLINICAL DATA: Left flank pain for 1 day. Dyspnea. History of
appendectomy and cholecystectomy.

EXAM:
CT ABDOMEN AND PELVIS WITHOUT CONTRAST
TECHNIQUE: Multidetector CT imaging of the abdomen and pelvis was performed
following the standard protocol without IV contrast.

[Series 2: axial st · axial · 0.98mm/px · z∈[-716,-216]mm · 13 of 110 slices shown, 15 images]
[im 5/110  soft-tissue]
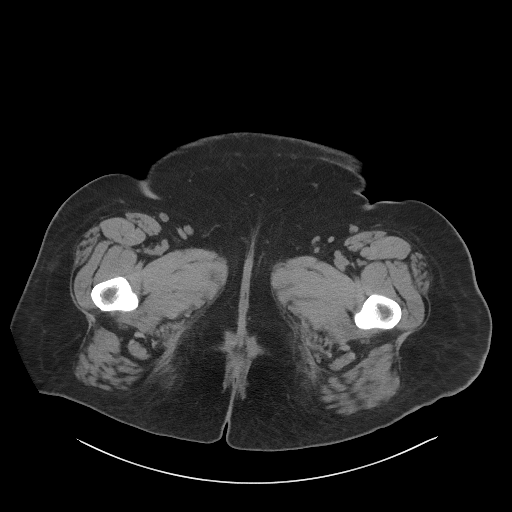
[im 5/110  bone]
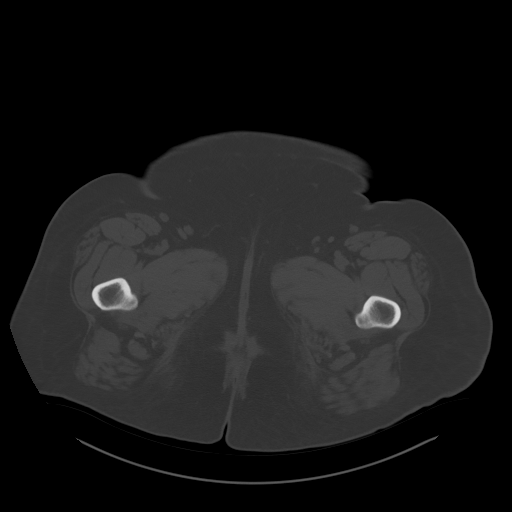
[im 14/110  soft-tissue]
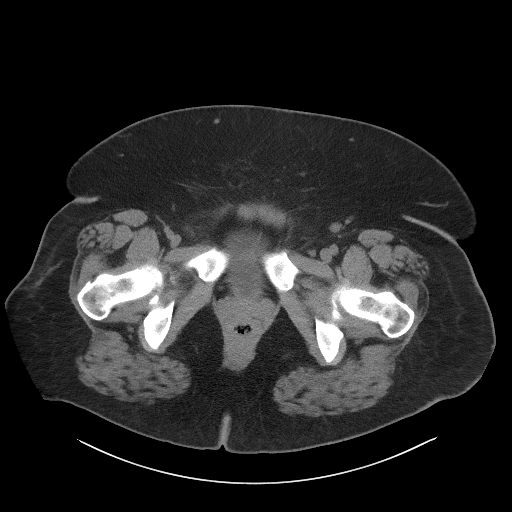
[im 23/110  soft-tissue]
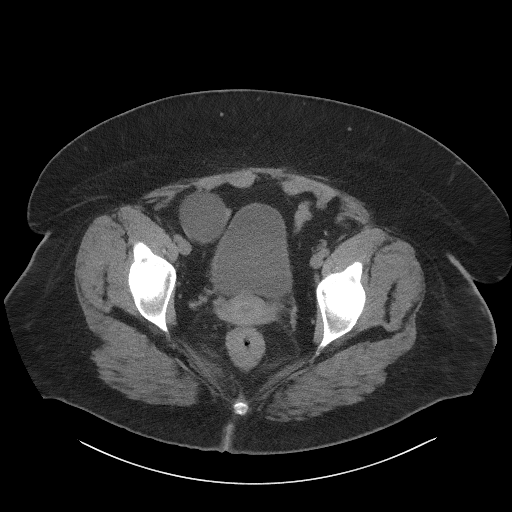
[im 32/110  soft-tissue]
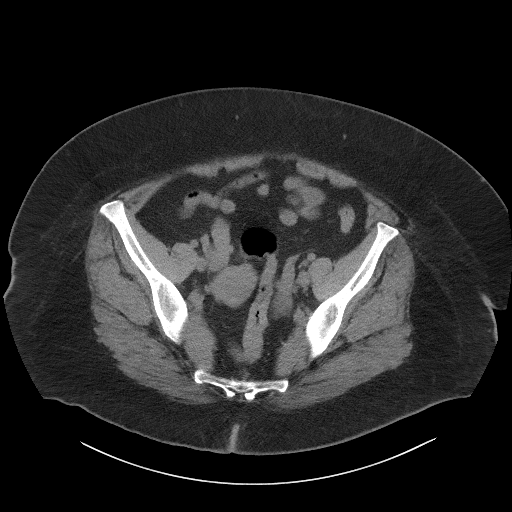
[im 37/110  soft-tissue]
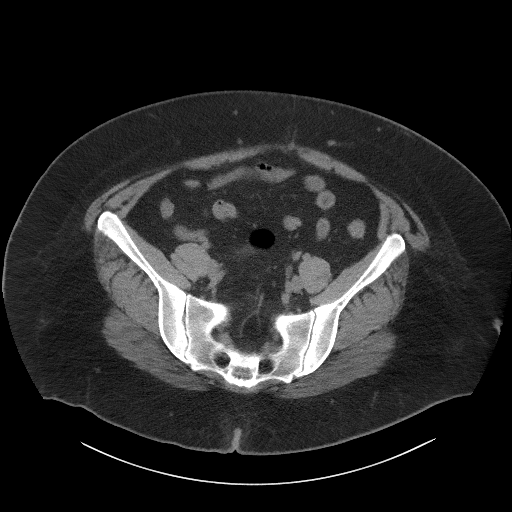
[im 46/110  soft-tissue]
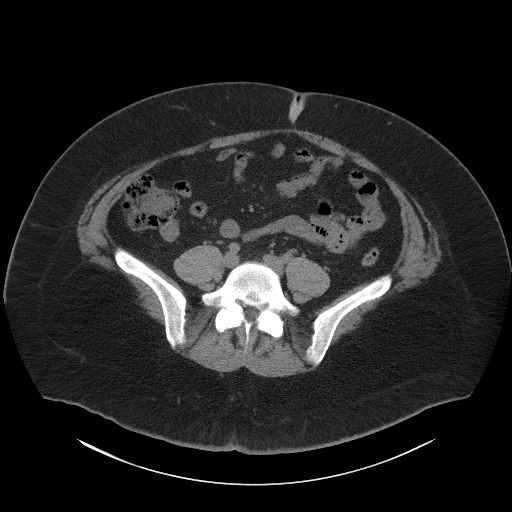
[im 55/110  soft-tissue]
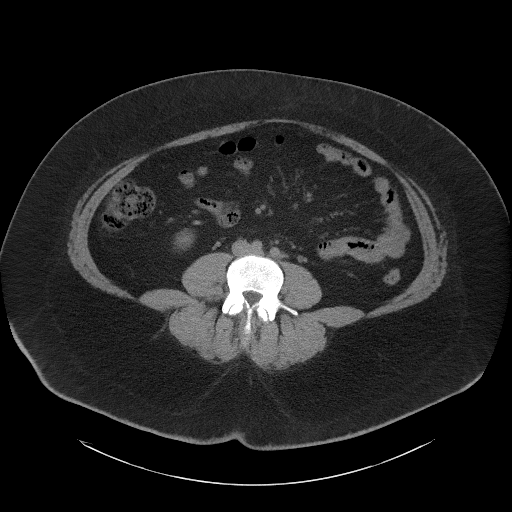
[im 64/110  soft-tissue]
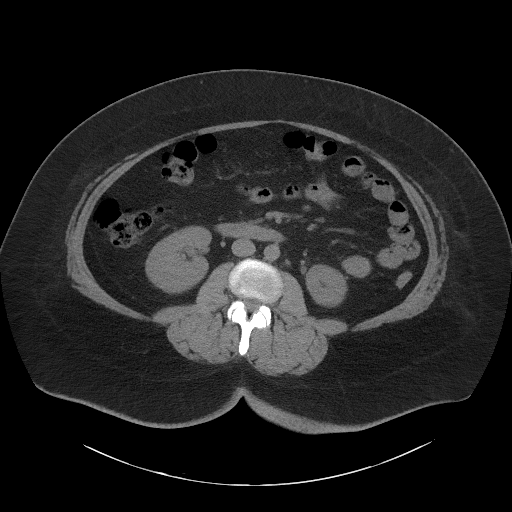
[im 73/110  soft-tissue]
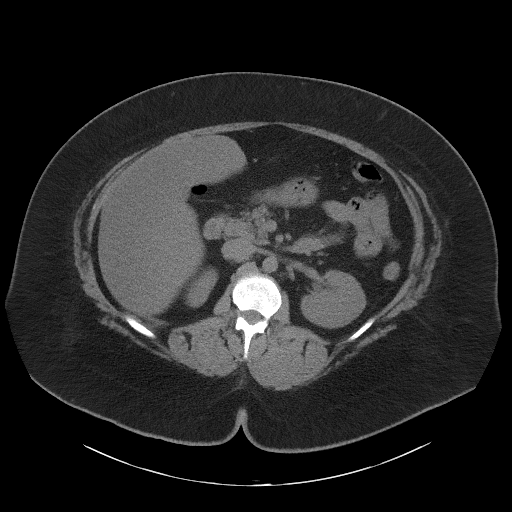
[im 73/110  bone]
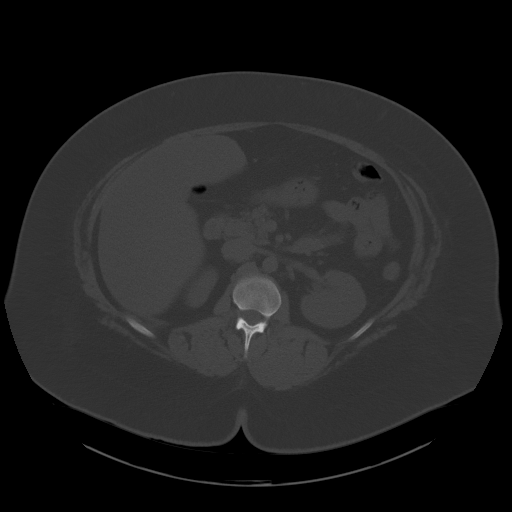
[im 78/110  soft-tissue]
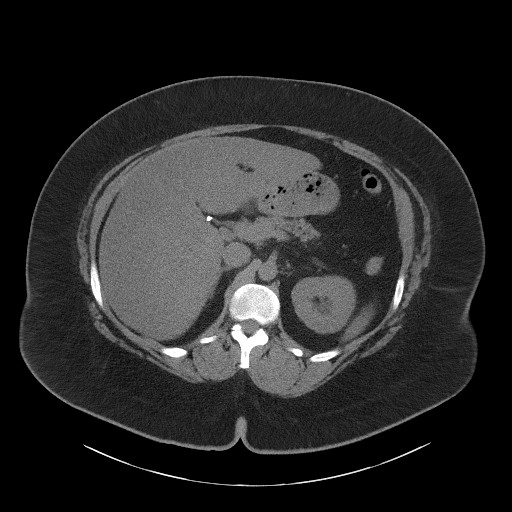
[im 87/110  soft-tissue]
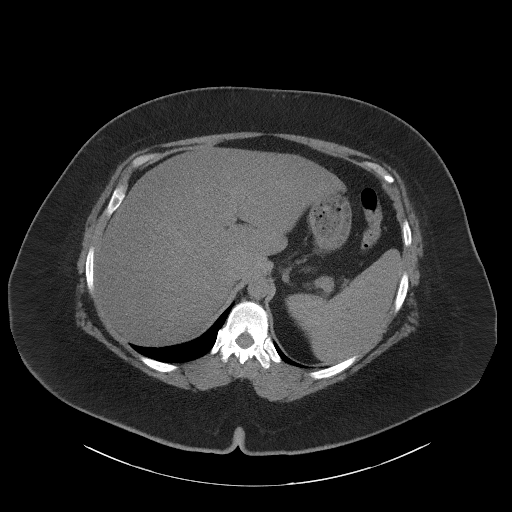
[im 96/110  soft-tissue]
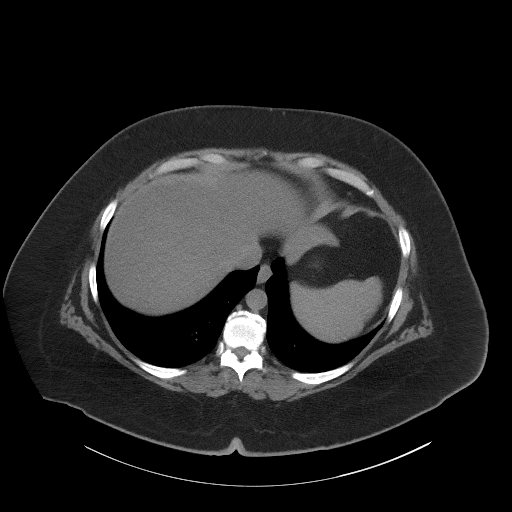
[im 105/110  soft-tissue]
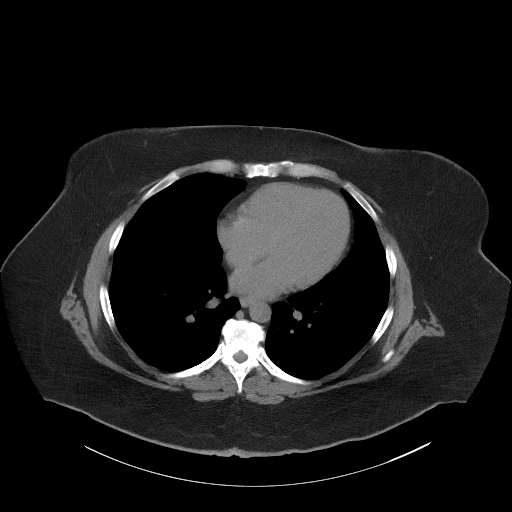

[Series 5: coronal st · coronal · 1.13mm/px · 3 of 117 slices shown]
[im 39/117  soft-tissue]
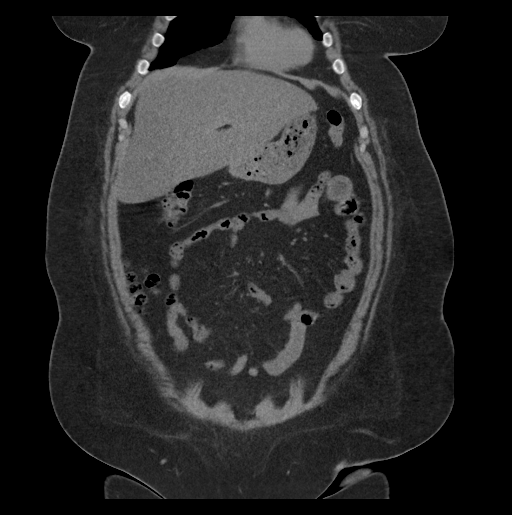
[im 52/117  soft-tissue]
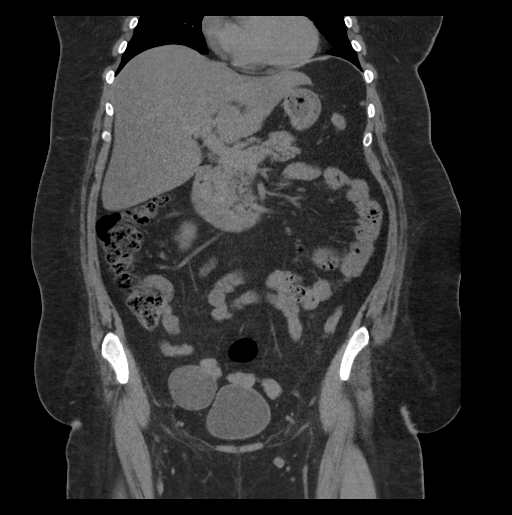
[im 65/117  soft-tissue]
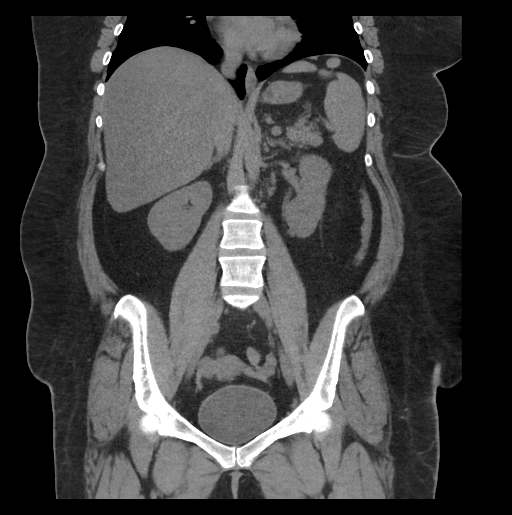

[16 of 46 positions shown; findings below may reference images not displayed]

FINDINGS: Lower chest: No significant pulmonary nodules or acute consolidative
airspace disease.

Hepatobiliary: Diffuse hepatic steatosis. No definite liver surface
irregularity. Normal liver size. No liver mass. Cholecystectomy. No
biliary ductal dilatation.

Pancreas: Normal, with no mass or duct dilation.

Spleen: Normal size. No mass.

Adrenals/Urinary Tract: Normal adrenals. Obstructing 3 mm left
ureterovesical junction stone with mild left hydroureteronephrosis.
No stones in the kidneys. No additional ureteral stones. No right
hydronephrosis. Normal caliber right ureter. No contour deforming
renal masses. Normal bladder.

Stomach/Bowel: Normal non-distended stomach. Normal caliber small
bowel with no small bowel wall thickening. Appendectomy. Normal
large bowel with no diverticulosis, large bowel wall thickening or
pericolonic fat stranding.

Vascular/Lymphatic: Normal caliber abdominal aorta. No
pathologically enlarged lymph nodes in the abdomen or pelvis.

Reproductive: Normal uterus. Simple 5.4 cm right adnexal cyst
(series 2/image 87). No left adnexal mass.

Other: No pneumoperitoneum, ascites or focal fluid collection.

Musculoskeletal: No aggressive appearing focal osseous lesions. Mild
lower thoracic spondylosis.
IMPRESSION: 1. Obstructing 3 mm left UVJ stone with mild left
hydroureteronephrosis.
2. Simple 5.4 cm right adnexal cyst. Follow-up pelvic ultrasound
advised in 6-12 weeks. This recommendation follows ACR consensus
guidelines: White Paper of the ACR Incidental Findings Committee II
on Adnexal Findings. [HOSPITAL] [DATE].
3. Diffuse hepatic steatosis.

## 2021-11-12 IMAGING — DX DG CHEST 1V PORT
1 series · 1 of 1 positions shown · non-contrast
Comparison: No prior.

CLINICAL DATA: Shortness of breath.  COVID positive.

EXAM:
PORTABLE CHEST 1 VIEW

[chest ap]
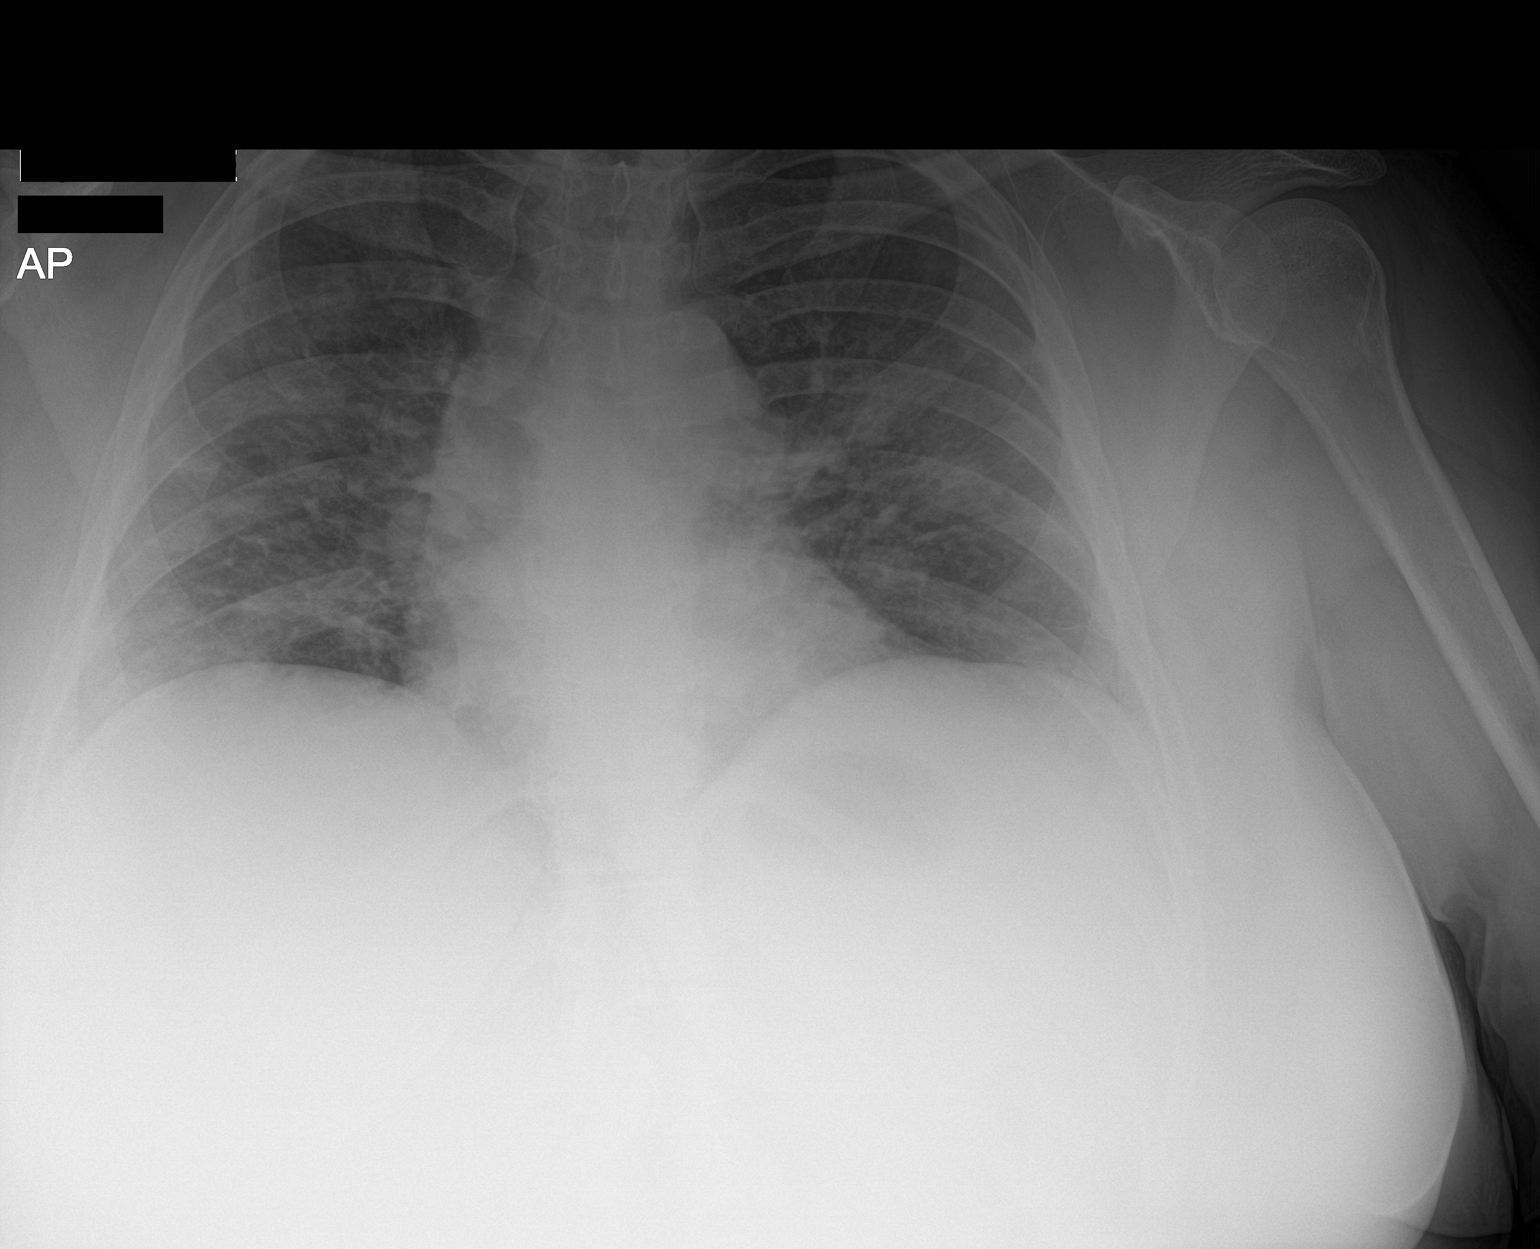

[1 of 1 positions shown; findings below may reference images not displayed]

FINDINGS: Mediastinum is normal. Heart size normal. Diffuse multifocal
bilateral pulmonary infiltrates. No pleural effusion or
pneumothorax. No acute bony abnormality.
IMPRESSION: Diffuse multifocal bilateral pulmonary infiltrates.
